# Patient Record
Sex: Female | Born: 1945 | Race: White | Hispanic: No | Smoking: Never smoker
Health system: Southern US, Community
[De-identification: ages and names within clinical notes are randomized; demographics above are authoritative.]

## PROBLEM LIST (undated history)

## (undated) DIAGNOSIS — H269 Unspecified cataract: Secondary | ICD-10-CM

## (undated) DIAGNOSIS — S9030XA Contusion of unspecified foot, initial encounter: Secondary | ICD-10-CM

## (undated) DIAGNOSIS — Z96652 Presence of left artificial knee joint: Secondary | ICD-10-CM

## (undated) DIAGNOSIS — Z9889 Other specified postprocedural states: Secondary | ICD-10-CM

## (undated) DIAGNOSIS — M1990 Unspecified osteoarthritis, unspecified site: Secondary | ICD-10-CM

## (undated) DIAGNOSIS — E559 Vitamin D deficiency, unspecified: Secondary | ICD-10-CM

## (undated) DIAGNOSIS — E663 Overweight: Secondary | ICD-10-CM

## (undated) DIAGNOSIS — R030 Elevated blood-pressure reading, without diagnosis of hypertension: Principal | ICD-10-CM

## (undated) HISTORY — DX: Contusion of unspecified foot, initial encounter: S90.30XA

## (undated) HISTORY — PX: BREAST BIOPSY: SHX20

## (undated) HISTORY — DX: Overweight: E66.3

## (undated) HISTORY — DX: Presence of left artificial knee joint: Z96.652

## (undated) HISTORY — DX: Other specified postprocedural states: Z98.890

## (undated) HISTORY — PX: REPLACEMENT TOTAL KNEE: SUR1224

## (undated) HISTORY — DX: Vitamin D deficiency, unspecified: E55.9

## (undated) HISTORY — DX: Unspecified osteoarthritis, unspecified site: M19.90

## (undated) HISTORY — PX: REDUCTION MAMMAPLASTY: SUR839

## (undated) HISTORY — DX: Unspecified cataract: H26.9

## (undated) HISTORY — DX: Elevated blood-pressure reading, without diagnosis of hypertension: R03.0

## (undated) HISTORY — PX: COLONOSCOPY: SHX174

## (undated) HISTORY — PX: BREAST SURGERY: SHX581

---

## 2011-10-15 DIAGNOSIS — H35379 Puckering of macula, unspecified eye: Secondary | ICD-10-CM | POA: Diagnosis not present

## 2011-10-15 DIAGNOSIS — H2510 Age-related nuclear cataract, unspecified eye: Secondary | ICD-10-CM | POA: Diagnosis not present

## 2012-01-04 DIAGNOSIS — J329 Chronic sinusitis, unspecified: Secondary | ICD-10-CM | POA: Diagnosis not present

## 2012-01-04 DIAGNOSIS — J029 Acute pharyngitis, unspecified: Secondary | ICD-10-CM | POA: Diagnosis not present

## 2012-01-05 DIAGNOSIS — J029 Acute pharyngitis, unspecified: Secondary | ICD-10-CM | POA: Diagnosis not present

## 2012-01-17 DIAGNOSIS — L570 Actinic keratosis: Secondary | ICD-10-CM | POA: Diagnosis not present

## 2012-01-17 DIAGNOSIS — L820 Inflamed seborrheic keratosis: Secondary | ICD-10-CM | POA: Diagnosis not present

## 2012-01-17 DIAGNOSIS — B079 Viral wart, unspecified: Secondary | ICD-10-CM | POA: Diagnosis not present

## 2012-01-17 DIAGNOSIS — D485 Neoplasm of uncertain behavior of skin: Secondary | ICD-10-CM | POA: Diagnosis not present

## 2012-03-19 DIAGNOSIS — M899 Disorder of bone, unspecified: Secondary | ICD-10-CM | POA: Diagnosis not present

## 2012-03-19 DIAGNOSIS — Z79899 Other long term (current) drug therapy: Secondary | ICD-10-CM | POA: Diagnosis not present

## 2012-03-19 DIAGNOSIS — M949 Disorder of cartilage, unspecified: Secondary | ICD-10-CM | POA: Diagnosis not present

## 2012-03-19 DIAGNOSIS — Z0000 Encounter for general adult medical examination without abnormal findings: Secondary | ICD-10-CM | POA: Diagnosis not present

## 2012-04-14 DIAGNOSIS — Z1211 Encounter for screening for malignant neoplasm of colon: Secondary | ICD-10-CM | POA: Diagnosis not present

## 2012-04-28 DIAGNOSIS — Z1231 Encounter for screening mammogram for malignant neoplasm of breast: Secondary | ICD-10-CM | POA: Diagnosis not present

## 2012-07-31 DIAGNOSIS — Z23 Encounter for immunization: Secondary | ICD-10-CM | POA: Diagnosis not present

## 2012-10-13 DIAGNOSIS — H2510 Age-related nuclear cataract, unspecified eye: Secondary | ICD-10-CM | POA: Diagnosis not present

## 2012-10-13 DIAGNOSIS — H353190 Nonexudative age-related macular degeneration, unspecified eye, stage unspecified: Secondary | ICD-10-CM | POA: Diagnosis not present

## 2012-10-13 DIAGNOSIS — H35379 Puckering of macula, unspecified eye: Secondary | ICD-10-CM | POA: Diagnosis not present

## 2012-10-13 DIAGNOSIS — H26019 Infantile and juvenile cortical, lamellar, or zonular cataract, unspecified eye: Secondary | ICD-10-CM | POA: Diagnosis not present

## 2012-11-04 DIAGNOSIS — M79609 Pain in unspecified limb: Secondary | ICD-10-CM | POA: Diagnosis not present

## 2012-12-24 ENCOUNTER — Ambulatory Visit
Admission: RE | Admit: 2012-12-24 | Discharge: 2012-12-24 | Disposition: A | Discharge status: Home / Self Care | Payer: Self-pay | Source: Ambulatory Visit | Attending: Family Medicine | Admitting: Family Medicine

## 2012-12-24 ENCOUNTER — Other Ambulatory Visit: Payer: Self-pay | Admitting: Family Medicine

## 2012-12-24 DIAGNOSIS — R52 Pain, unspecified: Secondary | ICD-10-CM

## 2012-12-24 DIAGNOSIS — M79609 Pain in unspecified limb: Secondary | ICD-10-CM | POA: Diagnosis not present

## 2012-12-24 DIAGNOSIS — M25519 Pain in unspecified shoulder: Secondary | ICD-10-CM | POA: Diagnosis not present

## 2012-12-24 DIAGNOSIS — Z23 Encounter for immunization: Secondary | ICD-10-CM | POA: Diagnosis not present

## 2012-12-24 DIAGNOSIS — Z1212 Encounter for screening for malignant neoplasm of rectum: Secondary | ICD-10-CM | POA: Diagnosis not present

## 2012-12-24 DIAGNOSIS — IMO0001 Reserved for inherently not codable concepts without codable children: Secondary | ICD-10-CM | POA: Diagnosis not present

## 2013-02-27 DIAGNOSIS — M25519 Pain in unspecified shoulder: Secondary | ICD-10-CM | POA: Diagnosis not present

## 2013-03-11 DIAGNOSIS — M25519 Pain in unspecified shoulder: Secondary | ICD-10-CM | POA: Diagnosis not present

## 2013-03-13 DIAGNOSIS — M7500 Adhesive capsulitis of unspecified shoulder: Secondary | ICD-10-CM | POA: Diagnosis not present

## 2013-03-13 DIAGNOSIS — M25519 Pain in unspecified shoulder: Secondary | ICD-10-CM | POA: Diagnosis not present

## 2013-05-04 DIAGNOSIS — M25579 Pain in unspecified ankle and joints of unspecified foot: Secondary | ICD-10-CM | POA: Diagnosis not present

## 2013-05-04 DIAGNOSIS — M79609 Pain in unspecified limb: Secondary | ICD-10-CM | POA: Diagnosis not present

## 2013-06-23 DIAGNOSIS — M25579 Pain in unspecified ankle and joints of unspecified foot: Secondary | ICD-10-CM | POA: Diagnosis not present

## 2013-06-23 DIAGNOSIS — M19079 Primary osteoarthritis, unspecified ankle and foot: Secondary | ICD-10-CM | POA: Diagnosis not present

## 2013-07-07 DIAGNOSIS — M25579 Pain in unspecified ankle and joints of unspecified foot: Secondary | ICD-10-CM | POA: Diagnosis not present

## 2013-07-14 DIAGNOSIS — M25579 Pain in unspecified ankle and joints of unspecified foot: Secondary | ICD-10-CM | POA: Diagnosis not present

## 2013-07-14 DIAGNOSIS — M19079 Primary osteoarthritis, unspecified ankle and foot: Secondary | ICD-10-CM | POA: Diagnosis not present

## 2013-07-28 DIAGNOSIS — Z23 Encounter for immunization: Secondary | ICD-10-CM | POA: Diagnosis not present

## 2013-09-06 IMAGING — CR DG HUMERUS 2V *R*
2 series · 2 of 2 positions shown · non-contrast
Comparison: None.

CLINICAL DATA: Over exertion with pain.  Duration of symptoms 5
weeks.

RIGHT HUMERUS - 2+ VIEW

[w humerus ap right *]
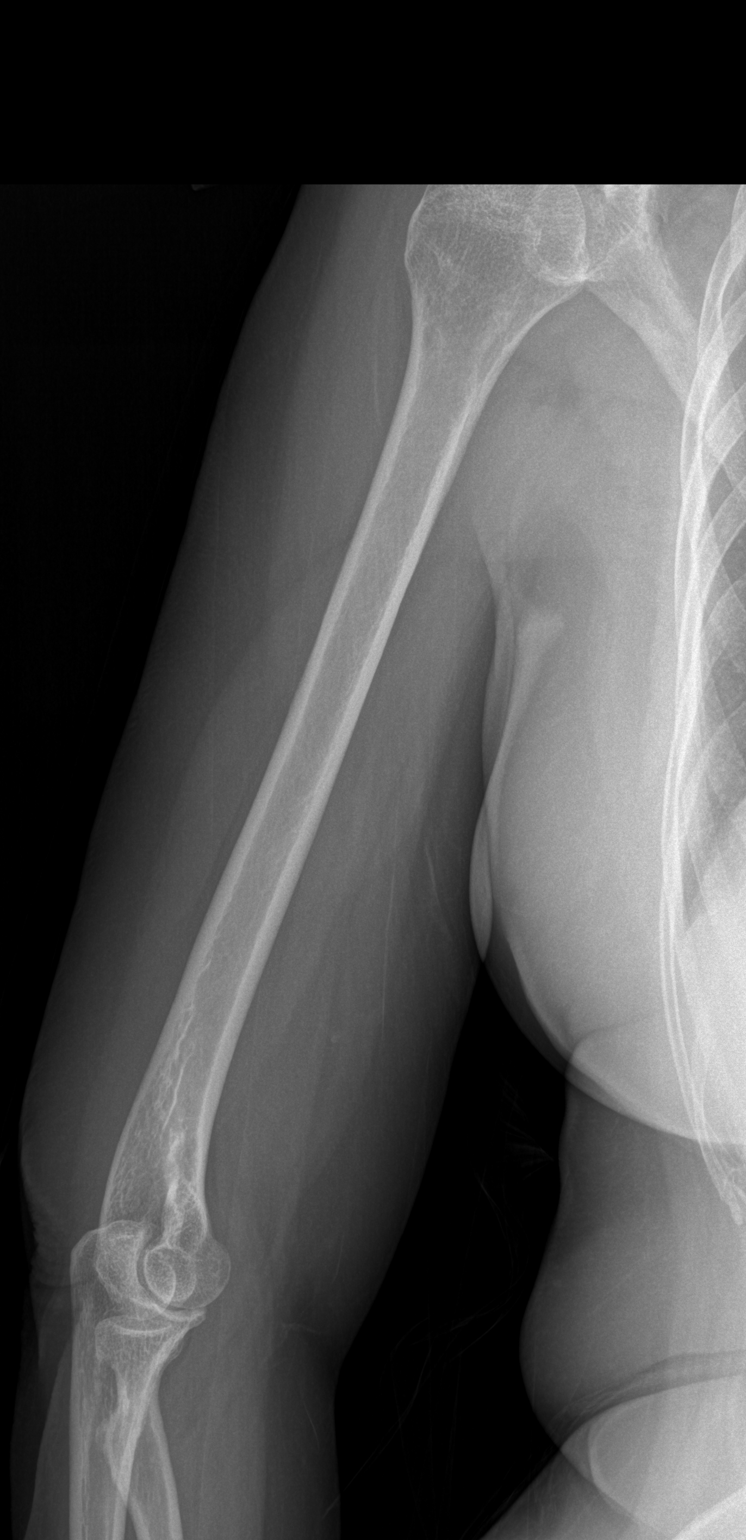

[w humerus lat right *]
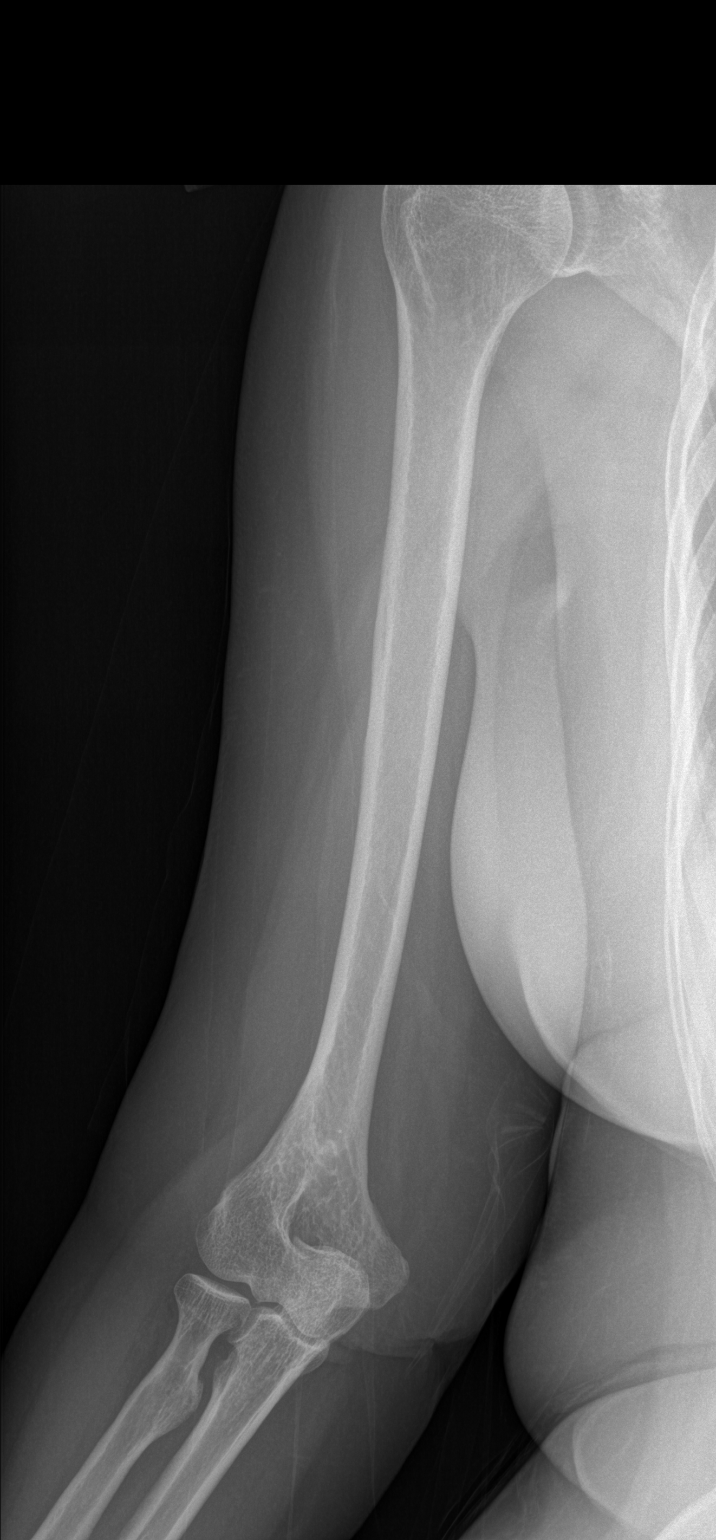

[2 of 2 positions shown; findings below may reference images not displayed]

FINDINGS: No evidence of fracture or other focal lesion.  Soft
tissues appear unremarkable.
IMPRESSION: Negative radiographs

## 2013-12-02 DIAGNOSIS — L7211 Pilar cyst: Secondary | ICD-10-CM | POA: Diagnosis not present

## 2013-12-02 DIAGNOSIS — D485 Neoplasm of uncertain behavior of skin: Secondary | ICD-10-CM | POA: Diagnosis not present

## 2013-12-02 DIAGNOSIS — L821 Other seborrheic keratosis: Secondary | ICD-10-CM | POA: Diagnosis not present

## 2013-12-04 ENCOUNTER — Other Ambulatory Visit: Payer: Self-pay

## 2013-12-04 DIAGNOSIS — Z9889 Other specified postprocedural states: Secondary | ICD-10-CM

## 2013-12-04 DIAGNOSIS — Z1231 Encounter for screening mammogram for malignant neoplasm of breast: Secondary | ICD-10-CM

## 2013-12-11 ENCOUNTER — Ambulatory Visit: Payer: Medicare Other

## 2014-01-28 DIAGNOSIS — L570 Actinic keratosis: Secondary | ICD-10-CM | POA: Diagnosis not present

## 2014-01-28 DIAGNOSIS — L819 Disorder of pigmentation, unspecified: Secondary | ICD-10-CM | POA: Diagnosis not present

## 2014-01-28 DIAGNOSIS — L821 Other seborrheic keratosis: Secondary | ICD-10-CM | POA: Diagnosis not present

## 2014-01-28 DIAGNOSIS — Z85828 Personal history of other malignant neoplasm of skin: Secondary | ICD-10-CM | POA: Diagnosis not present

## 2014-01-28 DIAGNOSIS — Z411 Encounter for cosmetic surgery: Secondary | ICD-10-CM | POA: Diagnosis not present

## 2014-01-28 DIAGNOSIS — D239 Other benign neoplasm of skin, unspecified: Secondary | ICD-10-CM | POA: Diagnosis not present

## 2014-03-17 ENCOUNTER — Ambulatory Visit
Admission: RE | Admit: 2014-03-17 | Discharge: 2014-03-17 | Disposition: A | Discharge status: Home / Self Care | Payer: Medicare Other | Source: Ambulatory Visit

## 2014-03-17 DIAGNOSIS — Z1231 Encounter for screening mammogram for malignant neoplasm of breast: Secondary | ICD-10-CM | POA: Diagnosis not present

## 2014-03-17 DIAGNOSIS — Z9889 Other specified postprocedural states: Secondary | ICD-10-CM

## 2014-07-27 DIAGNOSIS — Z23 Encounter for immunization: Secondary | ICD-10-CM | POA: Diagnosis not present

## 2014-11-28 IMAGING — MG MM SCREENING BREAST TOMO BILATERAL
8 of 12 series · 8 of 28 positions shown · non-contrast
Comparison: Previous exam(s).

CLINICAL DATA: Screening.

EXAM:
DIGITAL SCREENING BILATERAL MAMMOGRAM WITH 3D TOMO WITH CAD

[L MLO]
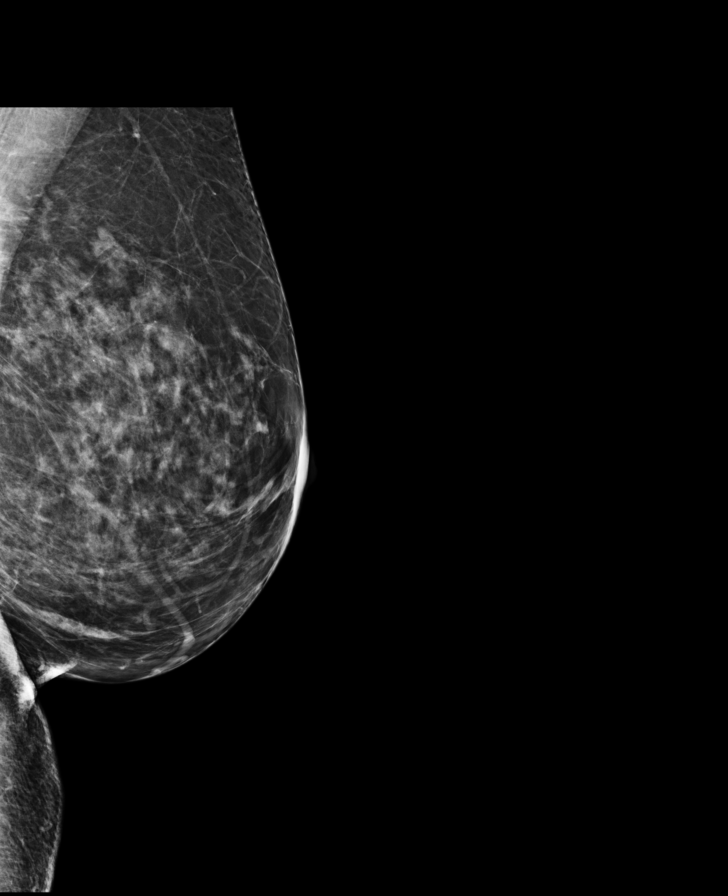

[R CC]
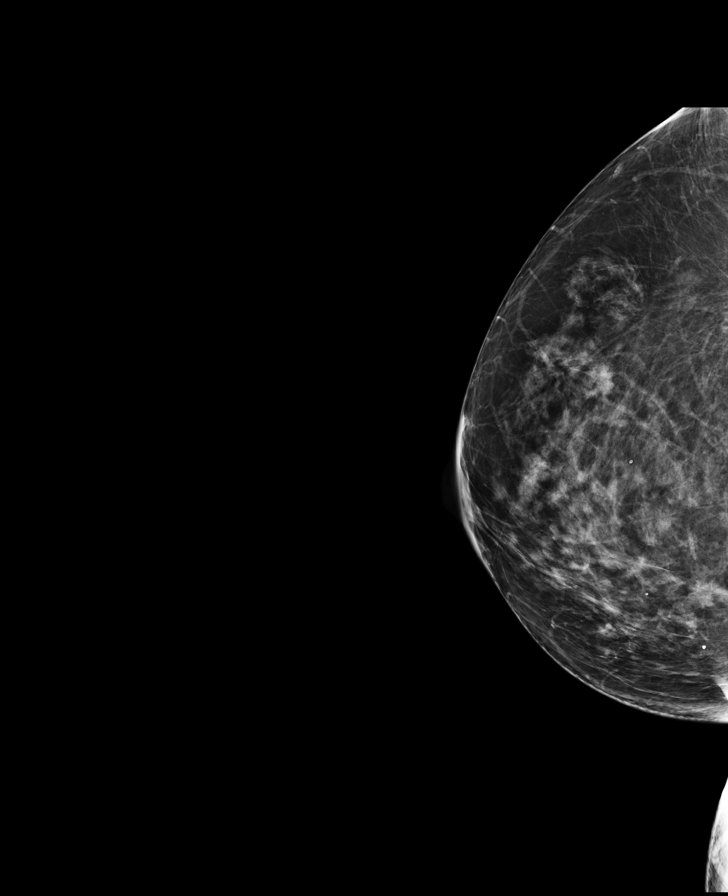

[R CC synth-2D]
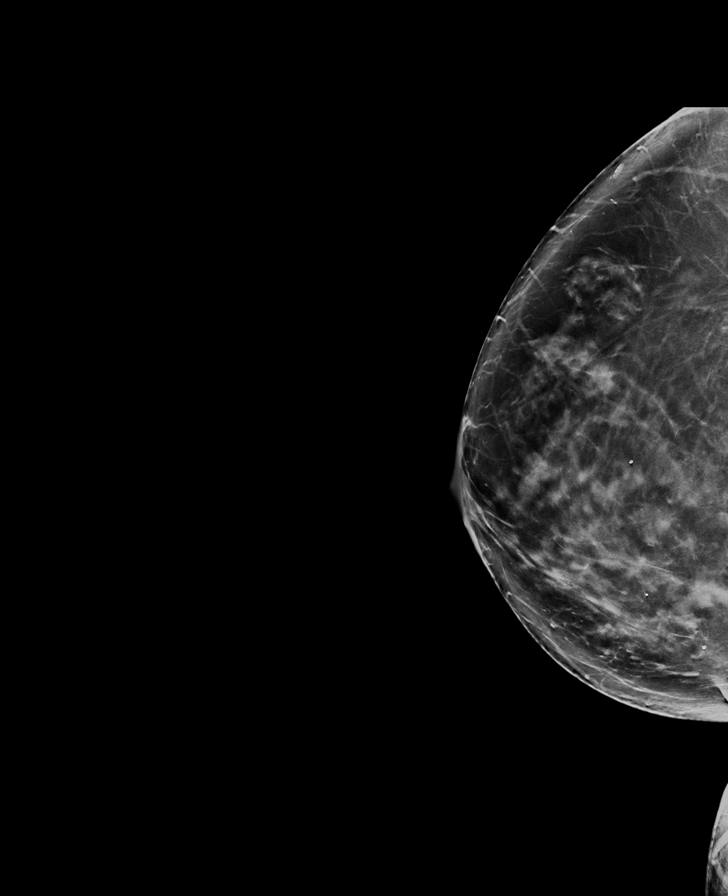

[R MLO]
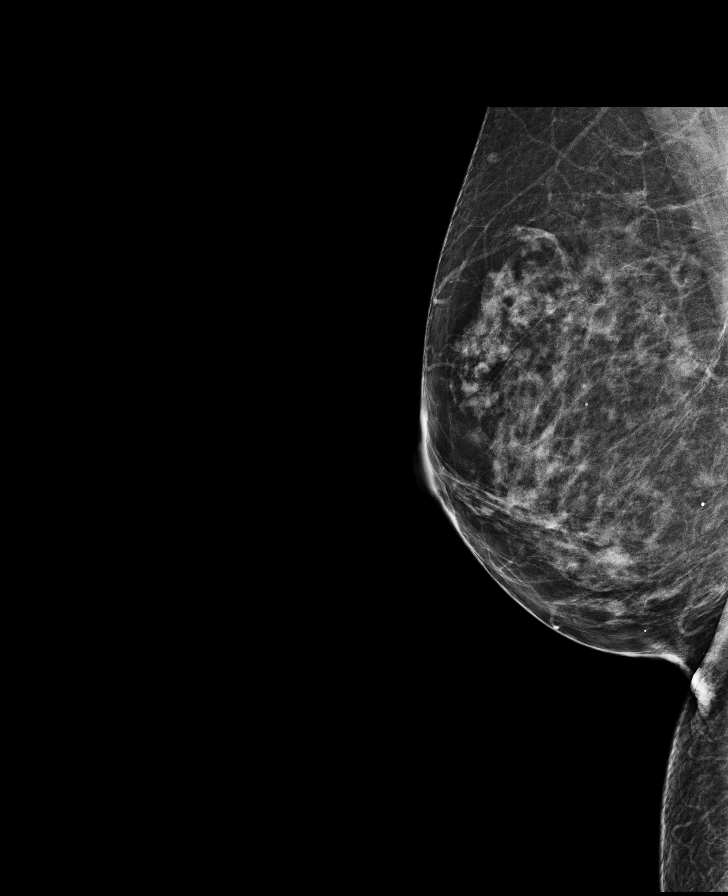

[R MLO synth-2D]
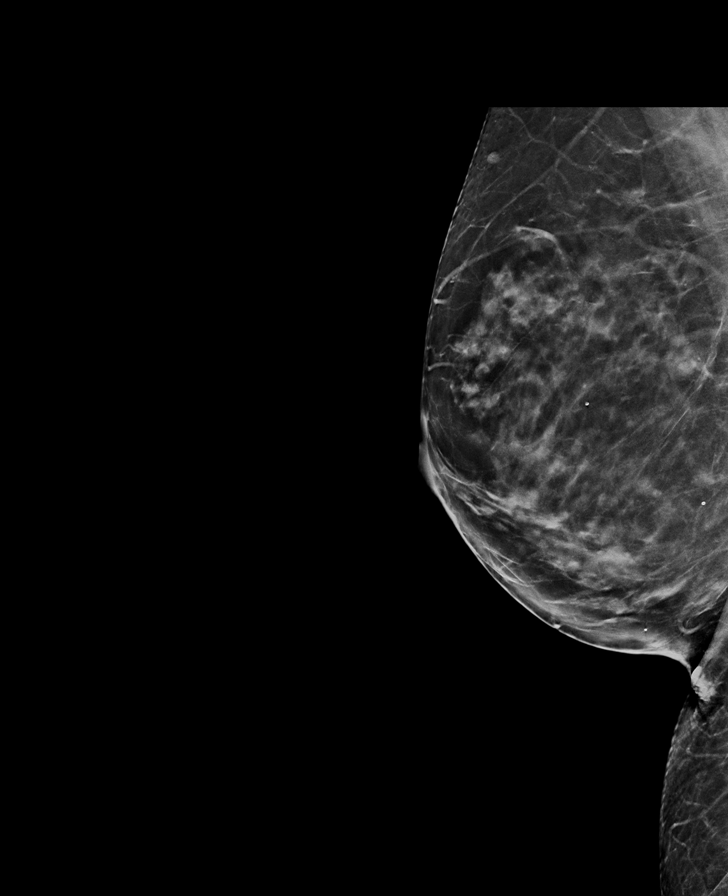

[L MLO synth-2D]
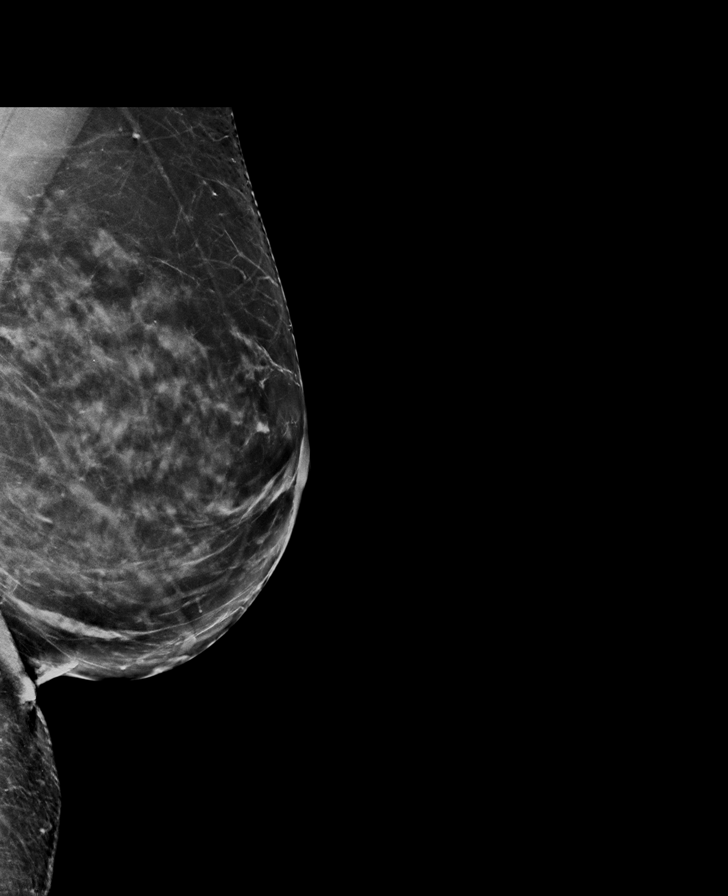

[L CC synth-2D]
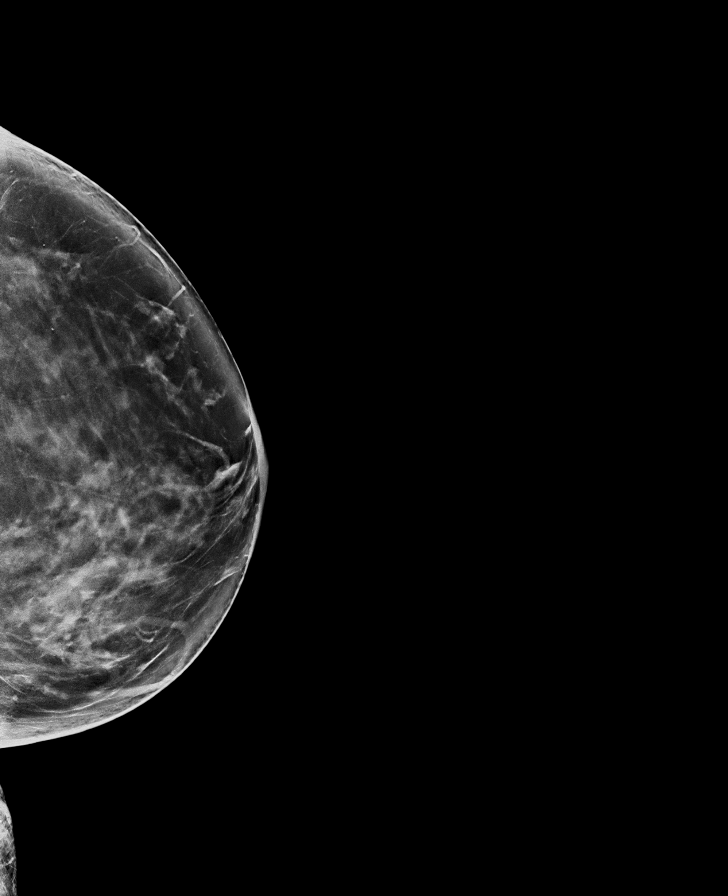

[L CC]
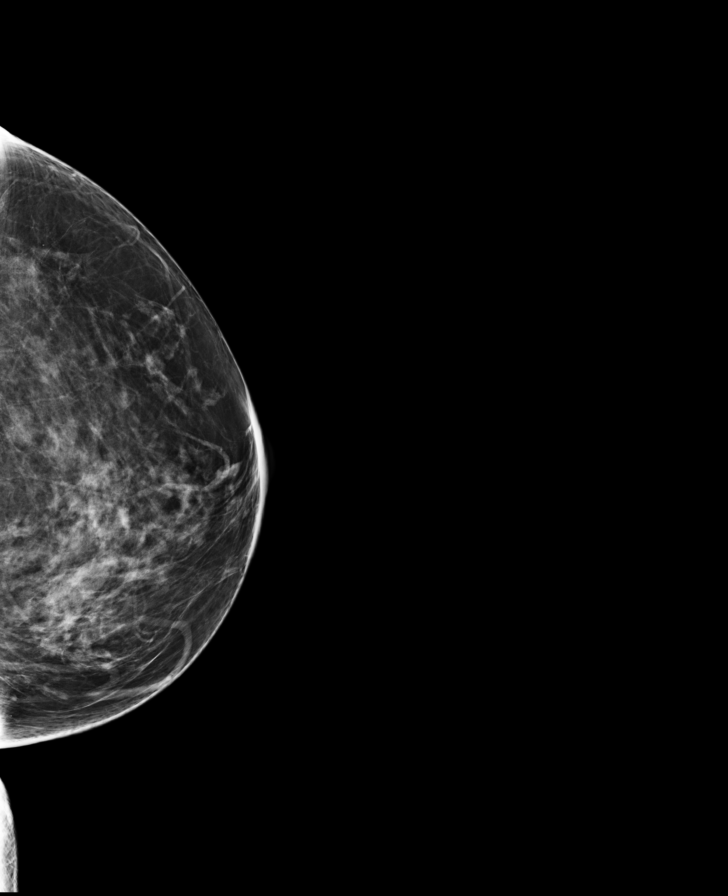

[8 of 28 positions shown; findings below may reference images not displayed]

ACR Breast Density Category c: The breast tissue is heterogeneously
dense, which may obscure small masses.
FINDINGS: There are no findings suspicious for malignancy. Images were
processed with CAD.
IMPRESSION: No mammographic evidence of malignancy. A result letter of this
screening mammogram will be mailed directly to the patient.

RECOMMENDATION:
Screening mammogram in one year. (Code:[id number])

BI-RADS CATEGORY  1: Negative.

## 2014-12-23 DIAGNOSIS — L570 Actinic keratosis: Secondary | ICD-10-CM | POA: Diagnosis not present

## 2015-03-02 DIAGNOSIS — L814 Other melanin hyperpigmentation: Secondary | ICD-10-CM | POA: Diagnosis not present

## 2015-03-02 DIAGNOSIS — L821 Other seborrheic keratosis: Secondary | ICD-10-CM | POA: Diagnosis not present

## 2015-03-02 DIAGNOSIS — Z85828 Personal history of other malignant neoplasm of skin: Secondary | ICD-10-CM | POA: Diagnosis not present

## 2015-03-02 DIAGNOSIS — L723 Sebaceous cyst: Secondary | ICD-10-CM | POA: Diagnosis not present

## 2015-03-02 DIAGNOSIS — D2272 Melanocytic nevi of left lower limb, including hip: Secondary | ICD-10-CM | POA: Diagnosis not present

## 2015-07-11 ENCOUNTER — Other Ambulatory Visit: Payer: Self-pay

## 2015-07-11 DIAGNOSIS — Z1231 Encounter for screening mammogram for malignant neoplasm of breast: Secondary | ICD-10-CM

## 2015-07-14 DIAGNOSIS — N390 Urinary tract infection, site not specified: Secondary | ICD-10-CM | POA: Diagnosis not present

## 2015-07-14 DIAGNOSIS — R300 Dysuria: Secondary | ICD-10-CM | POA: Diagnosis not present

## 2015-07-14 DIAGNOSIS — R350 Frequency of micturition: Secondary | ICD-10-CM | POA: Diagnosis not present

## 2015-08-01 DIAGNOSIS — H43813 Vitreous degeneration, bilateral: Secondary | ICD-10-CM | POA: Diagnosis not present

## 2015-08-01 DIAGNOSIS — H35373 Puckering of macula, bilateral: Secondary | ICD-10-CM | POA: Diagnosis not present

## 2015-08-01 DIAGNOSIS — H2513 Age-related nuclear cataract, bilateral: Secondary | ICD-10-CM | POA: Diagnosis not present

## 2015-08-02 DIAGNOSIS — N390 Urinary tract infection, site not specified: Secondary | ICD-10-CM | POA: Diagnosis not present

## 2015-08-16 ENCOUNTER — Ambulatory Visit
Admission: RE | Admit: 2015-08-16 | Discharge: 2015-08-16 | Disposition: A | Discharge status: Home / Self Care | Payer: Medicare Other | Source: Ambulatory Visit

## 2015-08-16 DIAGNOSIS — Z1231 Encounter for screening mammogram for malignant neoplasm of breast: Secondary | ICD-10-CM | POA: Diagnosis not present

## 2016-03-07 DIAGNOSIS — L821 Other seborrheic keratosis: Secondary | ICD-10-CM | POA: Diagnosis not present

## 2016-03-07 DIAGNOSIS — D3610 Benign neoplasm of peripheral nerves and autonomic nervous system, unspecified: Secondary | ICD-10-CM | POA: Diagnosis not present

## 2016-03-07 DIAGNOSIS — L723 Sebaceous cyst: Secondary | ICD-10-CM | POA: Diagnosis not present

## 2016-03-07 DIAGNOSIS — D2272 Melanocytic nevi of left lower limb, including hip: Secondary | ICD-10-CM | POA: Diagnosis not present

## 2016-03-07 DIAGNOSIS — L814 Other melanin hyperpigmentation: Secondary | ICD-10-CM | POA: Diagnosis not present

## 2016-03-07 DIAGNOSIS — Z85828 Personal history of other malignant neoplasm of skin: Secondary | ICD-10-CM | POA: Diagnosis not present

## 2016-04-05 ENCOUNTER — Emergency Department (HOSPITAL_COMMUNITY)
Admission: EM | Admit: 2016-04-05 | Discharge: 2016-04-05 | Disposition: A | Discharge status: Home / Self Care | Payer: Medicare Other | Attending: Emergency Medicine | Admitting: Emergency Medicine

## 2016-04-05 ENCOUNTER — Emergency Department (HOSPITAL_COMMUNITY): Payer: Medicare Other

## 2016-04-05 ENCOUNTER — Encounter (HOSPITAL_COMMUNITY): Payer: Self-pay | Admitting: *Deleted

## 2016-04-05 DIAGNOSIS — Y999 Unspecified external cause status: Secondary | ICD-10-CM | POA: Diagnosis not present

## 2016-04-05 DIAGNOSIS — Z7982 Long term (current) use of aspirin: Secondary | ICD-10-CM | POA: Diagnosis not present

## 2016-04-05 DIAGNOSIS — S022XXA Fracture of nasal bones, initial encounter for closed fracture: Secondary | ICD-10-CM | POA: Diagnosis not present

## 2016-04-05 DIAGNOSIS — Z79899 Other long term (current) drug therapy: Secondary | ICD-10-CM | POA: Diagnosis not present

## 2016-04-05 DIAGNOSIS — Y929 Unspecified place or not applicable: Secondary | ICD-10-CM | POA: Insufficient documentation

## 2016-04-05 DIAGNOSIS — W0110XA Fall on same level from slipping, tripping and stumbling with subsequent striking against unspecified object, initial encounter: Secondary | ICD-10-CM | POA: Insufficient documentation

## 2016-04-05 DIAGNOSIS — W19XXXA Unspecified fall, initial encounter: Secondary | ICD-10-CM | POA: Diagnosis not present

## 2016-04-05 DIAGNOSIS — Y9389 Activity, other specified: Secondary | ICD-10-CM | POA: Diagnosis not present

## 2016-04-05 DIAGNOSIS — S0993XA Unspecified injury of face, initial encounter: Secondary | ICD-10-CM | POA: Diagnosis not present

## 2016-04-05 DIAGNOSIS — S0083XA Contusion of other part of head, initial encounter: Secondary | ICD-10-CM | POA: Diagnosis not present

## 2016-04-05 MED ORDER — IBUPROFEN 200 MG PO TABS
400.0000 mg | ORAL_TABLET | Freq: Once | ORAL | Status: AC
  Administered 2016-04-05: 400 mg via ORAL
  Filled 2016-04-05: qty 2

## 2016-04-05 MED ORDER — IBUPROFEN 400 MG PO TABS: 400.0000 mg | ORAL_TABLET | Freq: Four times a day (QID) | ORAL | Status: DC | PRN

## 2016-04-05 NOTE — ED Provider Notes (Signed)
CSN: [id number]     Arrival date & time 04/05/16  2020 History   First MD Initiated Contact with Patient 04/05/16 2039     Chief Complaint  Patient presents with  . Facial Injury   HPI Patient presents to the emergency room for evaluation of a facial injury. Patient was carrying items into her house today when she tripped and fell and landed on her face. She struck her nose and since then has developed bruising and swelling around her nose and her eyes. Patient denies any loss of consciousness. No visual changes. No headache. No neck pain. She Went to a walk-in clinic this evening who felt she needed to have a CT scan of her face a couple hours of the bruising that they noted on exam. History reviewed. No pertinent past medical history. History reviewed. No pertinent past surgical history. No family history on file. Social History  Substance Use Topics  . Smoking status: Never Smoker   . Smokeless tobacco: None  . Alcohol Use: No   OB History    No data available     Review of Systems  All other systems reviewed and are negative.     Allergies  Sulfa antibiotics  Home Medications   Prior to Admission medications   Medication Sig Start Date End Date Taking? Authorizing Provider  aspirin EC 81 MG tablet Take 81 mg by mouth daily.   Yes Historical Provider, MD  BIOTIN PO Take 1 tablet by mouth daily.   Yes Historical Provider, MD  calcium carbonate (OS-CAL - DOSED IN MG OF ELEMENTAL CALCIUM) 1250 (500 Ca) MG tablet Take 1 tablet by mouth daily with breakfast.   Yes Historical Provider, MD   BP 156/74 mmHg  Pulse 67  Resp 15  Ht 5\' 3"  (1.6 m)  Wt 68.04 kg  BMI 26.58 kg/m2  SpO2 97% Physical Exam  Constitutional: She appears well-developed and well-nourished. No distress.  HENT:  Head: Normocephalic.  Right Ear: External ear normal.  Left Ear: External ear normal.  Nose: Sinus tenderness present. No nose lacerations, nasal deformity, septal deviation or nasal septal  hematoma.  Peri nasal ecchymoses, EOMI, no inferior orbital ttp  Eyes: Conjunctivae are normal. Right eye exhibits no discharge. Left eye exhibits no discharge. No scleral icterus.  Neck: Neck supple. No tracheal deviation present.  Cardiovascular: Normal rate.   Pulmonary/Chest: Effort normal. No stridor. No respiratory distress.  Musculoskeletal: She exhibits no edema.  Neurological: She is alert. Cranial nerve deficit: no gross deficits.  Skin: Skin is warm and dry. No rash noted.  Psychiatric: She has a normal mood and affect.  Nursing note and vitals reviewed.   ED Course  Procedures (including critical care time) Labs Review Labs Reviewed - No data to display  Imaging Review Ct Maxillofacial Wo Cm  04/05/2016  CLINICAL DATA:  Facial injury from fall.  Initial encounter. EXAM: CT MAXILLOFACIAL WITHOUT CONTRAST TECHNIQUE: Multidetector CT imaging of the maxillofacial structures was performed. Multiplanar CT image reconstructions were also generated. A small metallic BB was placed on the right temple in order to reliably differentiate right from left. COMPARISON:  None. FINDINGS: Nondisplaced nasal arch fracture most convincing on sagittal reformats. Minimal mucosal gas on the left deep to the nasal arch. There is an overlying contusion with soft tissue emphysema tracking towards the right medial canthus. No opaque foreign body. Left maxillary fluid level is small but likely low-density. This could be related to epistaxis or inflammation as there is no associated  fracture. Nasal septum is deviated to the right without fracture lucency. Located and intact mandible. Incidental left TMJ osteoarthritis. No evidence of globe injury or postseptal hematoma. IMPRESSION: 1. Nondisplaced nasal arch fracture with overlying laceration and contusion. 2. Left maxillary sinus fluid level without associated fracture. Electronically Signed   By: Monte Fantasia M.D.   On: 04/05/2016 22:36   I have personally  reviewed and evaluated these images and lab results as part of my medical decision-making.    MDM   Final diagnoses:  Nasal fracture, closed, initial encounter    Nasal fx on CT.  Treat with ice and nsaids.  CT scan mentions a laceration but patient does not have a laceration on physical exam.    Dorie Rank, MD 04/05/16 2253

## 2016-04-05 NOTE — ED Notes (Signed)
Pt sent from [facility]; pt was carrying stuff into her house when she tripped and states that she landed on her face; pt with bruising and swelling to her nose and around her rt eye; pt states that she is not having visual changes; pt states that she cannot get air in through her nose and is having to breathe to her mouth; pt states that she takes 81mg  ASA daily; pt denies LOC

## 2016-04-05 NOTE — Discharge Instructions (Signed)
Nasal Fracture A nasal fracture is a break or crack in the bones or cartilage of the nose. Minor breaks do not require treatment. These breaks usually heal on their own after about one month. Serious breaks may require surgery. CAUSES This injury is usually caused by a blunt injury to the nose. This type of injury often occurs from:  Contact sports.  Car accidents.  Falls.  Getting punched. SYMPTOMS Symptoms of this injury include:  Pain.  Swelling of the nose.  Bleeding from the nose.  Bruising around the nose or eyes. This may include having black eyes.  Crooked appearance of the nose. DIAGNOSIS This injury may be diagnosed with a physical exam. The health care provider will gently feel the nose for signs of broken bones. He or she will look inside the nostrils to make sure that there is not a blood-filled swelling on the dividing wall between the nostrils (septal hematoma). X-rays of the nose may not show a nasal fracture even when one is present. In some cases, X-rays or a CT scan may be done 1-5 days after the injury. Sometimes, the health care provider will want to wait until the swelling has gone down. TREATMENT Often, minor fractures that have caused no deformity do not require treatment. More serious fractures in which bones have moved out of position may require surgery, which will take place after the swelling is gone. Surgery will stabilize and align the fracture. In some cases, a health care provider may be able to reposition the bones without surgery. This may be done in the health care provider's office after medicine is given to numb the area (local anesthetic). HOME CARE INSTRUCTIONS  If directed, apply ice to the injured area:  Put ice in a plastic bag.  Place a towel between your skin and the bag.  Leave the ice on for 20 minutes, 2-3 times per day.  Take over-the-counter and prescription medicines only as told by your health care provider.  If your nose  starts to bleed, sit in an upright position while you squeeze the soft parts of your nose against the dividing wall between your nostrils (septum) for 10 minutes.  Try to avoid blowing your nose.  Return to your normal activities as told by your health care provider. Ask your health care provider what activities are safe for you.  Avoid contact sports for 3-4 weeks or as told by your health care provider.  Keep all follow-up visits as told by your health care provider. This is important. SEEK MEDICAL CARE IF:  Your pain increases or becomes severe.  You continue to have nosebleeds.  The shape of your nose does not return to normal within 5 days.  You have pus draining out of your nose. SEEK IMMEDIATE MEDICAL CARE IF:  You have bleeding from your nose that does not stop after you pinch your nostrils closed for 20 minutes and keep ice on your nose.  You have clear fluid draining out of your nose.  You notice a grape-like swelling on the septum. This swelling is a collection of blood (hematoma) that must be drained to help prevent infection.  You have difficulty moving your eyes.  You have repeated vomiting.   This information is not intended to replace advice given to you by your health care provider. Make sure you discuss any questions you have with your health care provider.   Document Released: 08/31/2000 Document Revised: 05/25/2015 Document Reviewed: 10/11/2014 Elsevier Interactive Patient Education 2016 [employer]  

## 2016-04-09 ENCOUNTER — Ambulatory Visit (INDEPENDENT_AMBULATORY_CARE_PROVIDER_SITE_OTHER): Payer: Medicare Other | Admitting: Family Medicine

## 2016-04-09 ENCOUNTER — Encounter: Payer: Self-pay | Admitting: Family Medicine

## 2016-04-09 VITALS — BP 163/82 | HR 40 | Ht 62.25 in | Wt 148.9 lb

## 2016-04-09 DIAGNOSIS — E559 Vitamin D deficiency, unspecified: Secondary | ICD-10-CM

## 2016-04-09 DIAGNOSIS — Z9013 Acquired absence of bilateral breasts and nipples: Secondary | ICD-10-CM | POA: Diagnosis not present

## 2016-04-09 DIAGNOSIS — E663 Overweight: Secondary | ICD-10-CM | POA: Diagnosis not present

## 2016-04-09 DIAGNOSIS — S9030XA Contusion of unspecified foot, initial encounter: Secondary | ICD-10-CM | POA: Insufficient documentation

## 2016-04-09 DIAGNOSIS — R030 Elevated blood-pressure reading, without diagnosis of hypertension: Secondary | ICD-10-CM

## 2016-04-09 DIAGNOSIS — Z96652 Presence of left artificial knee joint: Secondary | ICD-10-CM | POA: Insufficient documentation

## 2016-04-09 DIAGNOSIS — Z9889 Other specified postprocedural states: Secondary | ICD-10-CM | POA: Insufficient documentation

## 2016-04-09 HISTORY — DX: Overweight: E66.3

## 2016-04-09 HISTORY — DX: Other specified postprocedural states: Z98.890

## 2016-04-09 HISTORY — DX: Presence of left artificial knee joint: Z96.652

## 2016-04-09 HISTORY — DX: Elevated blood-pressure reading, without diagnosis of hypertension: R03.0

## 2016-04-09 HISTORY — DX: Contusion of unspecified foot, initial encounter: S90.30XA

## 2016-04-09 HISTORY — DX: Vitamin D deficiency, unspecified: E55.9

## 2016-04-09 NOTE — Patient Instructions (Signed)
    Continue to monitor your blood pressure at home. Write down the values and bring in next office visit.    Top Ten Foods for Health  1. Water Drink at least 8 to 12 cups of water daily. Consume half of your body weight in pounds, is the amount of water in ounces to drink daily.  Ie: a 200lb person = 100 oz water daily  2. Dark Green Vegetables Eat dark green vegetables at least three to four times a week. Good options include broccoli, peppers, brussel sprouts and leafy greens like kale and spinach.  3. Whole Grains Whole grains should be included in your diet at least two to three times daily. Look for whole wheat flour, rye, oatmeal, barley, amaranth, quinoa or a multigrain. A good source of fiber includes 3 to 4 grams of fiber per serving. A great source has 5 or more grams of fiber per serving.  4. Beans and Lentils Try to eat a bean-based meal at least once a week. Try to add legumes, including beans and lentils, to soups, stews, casseroles, salads and dips or eat them plain.  5. Fish Try to eat two to three serving of fish a week. A serving consists of 3 to 4 ounces of cooked fish. Good choices are salmon, trout, herring, bluefish, sardines and tuna.  6. Berries Include two to four servings of fruit in your diet each day. Try to eat berries such as raspberries, blueberries, blackberries and strawberries.  7. Winter Squash Eat butternut and acorn squash as well as other richly pigmented dark orange and green colored vegetables like sweet potato, cantaloupe and mango.  8. Soy 25 grams of soy protein a day is recommended as part of a low-fat diet to help lower cholesterol levels. Try tofu, soymilk, edamame soybeans, tempeh and texturized vegetable protein (TVP).  9. Flaxseed, Nuts and Seeds Add 1 to 2 tablespoons of ground flaxseed or other seeds to food each day or include a moderate amount of nuts - 1/4 cup - in your daily diet.  10. Organic Yogurt Men and women between 64  and 69 years of age need 1000 milligrams of calcium a day and 1200 milligrams if 50 or older. Eat calcium-rich foods such as nonfat or low-fat dairy products three to four times a day. Include organic choices.

## 2016-04-09 NOTE — Progress Notes (Signed)
New patient office visit  Impression and Recommendations:    1. Elevated blood pressure (not hypertension)   2. Overweight (BMI 25.0-29.9)   3. Contusion, foot, unspecified laterality, initial encounter   4. Status post left knee replacement- 2004   5. S/P bilateral breast reduction   6. Vitamin D insufficiency      She'll continue to monitor blood pressure at home. Told her to write it down and keep a log and bring in next office visit  Patient will call Dr. Scarlette Ar office, [facility] orthopedics about continued foot pain that she's having Still after injury 2 years ago when she contused her foot.  Dr. Delilah Shan did an MRI and x-rays, and she will follow-up with them for this.  If she needs a referral she will let us know.  Handouts on healthy eating provided; BMI norms discussed and counseling performed.  Patient's Medications  New Prescriptions   No medications on file  Previous Medications   ASPIRIN EC 81 MG TABLET    Take 81 mg by mouth daily.   BIOTIN PO    Take 1 tablet by mouth daily.   CALCIUM CARBONATE (OS-CAL - DOSED IN MG OF ELEMENTAL CALCIUM) 1250 (500 CA) MG TABLET    Take 1 tablet by mouth daily with breakfast.   IBUPROFEN (ADVIL,MOTRIN) 400 MG TABLET    Take 1 tablet (400 mg total) by mouth every 6 (six) hours as needed for mild pain or moderate pain.  Modified Medications   No medications on file  Discontinued Medications   No medications on file    Return in about 4 weeks (around 05/07/2016) for For wellness exam & health maintenance evaluation.  The patient was counseled, risk factors were discussed, anticipatory guidance given.  Gross side effects, risk and benefits, and alternatives of medications discussed with patient.  Patient is aware that all medications have potential side effects and we are unable to predict every side effect or drug-drug interaction that may occur.  Expresses verbal understanding and consents to current therapy plan and  treatment regimen.  Please see AVS handed out to patient at the end of our visit for further patient instructions/ counseling done pertaining to today's office visit.    Note: This document was prepared using Dragon voice recognition software and may include unintentional dictation errors.  ----------------------------------------------------------------------------------------------------------------------    Subjective:    Chief Complaint  Patient presents with  . Establish Care    New patient visit.  HPI: Joann Wall is a pleasant 70 y.o. female who presents to [place] [facility] today to review their medical history with me and establish care.   Had seen Dr. Arelia Sneddon 2 years ago for her foot and he sent patient to [place].   Prior she had a Dr in [place], was last PCP- over 4 yrs ago.   Husband passed 7 yrs ago- and patient moved back to [place].   No history of hypertension.   Checks her BP - most days of the week b/c husband had HTN and she has machine.  Runs 140-120/ 80-70. No chest pain, shortness of breath, headache, visual changes, peripheral edema   Contusion of R top of foot several yrs ago- 2+, saw Dr Delilah Shan [facility]- had MRI and xrays etc.  She was told to stay off it,  so it would heal and patient never did.    It hurts her most days and also swells by the  end of the day.   Has not been able to start a walking program because this.      Patient Active Problem List   Diagnosis Date Noted  . S/P bilateral breast reduction- 2000 04/09/2016  . Status post left knee replacement- 2004 04/09/2016  . Contusion, R foot- sees dr Delilah Shan- [facility] 04/09/2016  . Elevated blood pressure (not hypertension) 04/09/2016  . Overweight (BMI 25.0-29.9) 04/09/2016  . Vitamin D insufficiency 04/09/2016     Past Medical History:  Diagnosis Date  . Contusion, R foot- sees dr Delilah Shan- [facility] 04/09/2016  . Elevated blood pressure (not  hypertension) 04/09/2016  . Overweight (BMI 25.0-29.9) 04/09/2016  . S/P bilateral breast reduction- 2000 04/09/2016  . Status post left knee replacement- 2004 04/09/2016  . Vitamin D insufficiency 04/09/2016     Past Surgical History:  Procedure Laterality Date  . BREAST SURGERY Bilateral    reduction  . REPLACEMENT TOTAL KNEE Left      Family History  Problem Relation Age of Onset  . Stroke Mother   . COPD Father   . Obesity Sister   . Hypertension Sister   . Crohn's disease Brother   . Healthy Daughter   . Healthy Son   . Suicidality Brother      History  Drug Use No    History  Alcohol Use  . 8.4 oz/week  . 14 Glasses of wine per week    History  Smoking Status  . Never Smoker  Smokeless Tobacco  . Never Used    Patient's Medications  New Prescriptions   No medications on file  Previous Medications   ASPIRIN EC 81 MG TABLET    Take 81 mg by mouth daily.   BIOTIN PO    Take 1 tablet by mouth daily.   CALCIUM CARBONATE (OS-CAL - DOSED IN MG OF ELEMENTAL CALCIUM) 1250 (500 CA) MG TABLET    Take 1 tablet by mouth daily with breakfast.   IBUPROFEN (ADVIL,MOTRIN) 400 MG TABLET    Take 1 tablet (400 mg total) by mouth every 6 (six) hours as needed for mild pain or moderate pain.  Modified Medications   No medications on file  Discontinued Medications   No medications on file    Sulfa antibiotics  Review of Systems:   ( Completed via Adult Medical History Intake form today ) General:  Denies fever, chills, appetite changes, unexplained weight loss.  Optho/Auditory:   Denies visual changes, blurred vision/LOV, ringing in ears/ diff hearing Respiratory:   Denies SOB, DOE, cough, wheezing.  Cardiovascular:   Denies chest pain, palpitations, new onset peripheral edema  Gastrointestinal:   Denies nausea, vomiting, diarrhea.  Genitourinary:    Denies dysuria, increased frequency, flank pain.  Endocrine:     Denies hot or cold intolerance, polyuria,  polydipsia. Musculoskeletal:  Denies unexplained myalgias, joint swelling, arthralgias, gait problems.  Skin:  Denies rash, suspicious lesions or new/ changes in moles Neurological:    Denies dizziness, syncope, unexplained weakness, lightheadedness, numbness  Psychiatric/Behavioral:   Denies mood changes, suicidal or homicidal ideations, hallucinations    Objective:   Blood pressure (!) 173/79, pulse (!) 40, height 5' 2.25" (1.581 m), weight 148 lb 14.4 oz (67.5 kg). Body mass index is 27.02 kg/m.  Vitals:   04/09/16 1512 04/09/16 1542  BP: (!) 173/79 (!) 163/82  Pulse: (!) 40     General: Well Developed, well nourished, and in no acute distress.  Neuro: Alert and oriented x3, extra-ocular muscles  intact, sensation grossly intact.  HEENT: Normocephalic, atraumatic, pupils equal round reactive to light, neck supple, no gross masses, no carotid bruits, no JVD apprec Skin: no gross suspicious lesions or rashes  Cardiac: Regular rate and rhythm, no murmurs rubs or gallops.  Respiratory: Essentially clear to auscultation bilaterally. Not using accessory muscles, speaking in full sentences.  Abdominal: Soft, not grossly distended Musculoskeletal: Ambulates w/o diff, FROM * 4 ext.  Vasc: less 2 sec cap RF, warm and pink  Psych:  No HI/SI, judgement and insight good.

## 2016-05-14 ENCOUNTER — Encounter: Payer: Self-pay | Admitting: Family Medicine

## 2016-05-14 ENCOUNTER — Ambulatory Visit (INDEPENDENT_AMBULATORY_CARE_PROVIDER_SITE_OTHER): Payer: Medicare Other | Admitting: Family Medicine

## 2016-05-14 VITALS — BP 150/83 | HR 61 | Wt 151.1 lb

## 2016-05-14 DIAGNOSIS — E559 Vitamin D deficiency, unspecified: Secondary | ICD-10-CM

## 2016-05-14 DIAGNOSIS — E663 Overweight: Secondary | ICD-10-CM | POA: Diagnosis not present

## 2016-05-14 DIAGNOSIS — Z1211 Encounter for screening for malignant neoplasm of colon: Secondary | ICD-10-CM

## 2016-05-14 DIAGNOSIS — Z0000 Encounter for general adult medical examination without abnormal findings: Secondary | ICD-10-CM | POA: Diagnosis not present

## 2016-05-14 DIAGNOSIS — Z1212 Encounter for screening for malignant neoplasm of rectum: Secondary | ICD-10-CM

## 2016-05-14 DIAGNOSIS — R030 Elevated blood-pressure reading, without diagnosis of hypertension: Secondary | ICD-10-CM

## 2016-05-14 NOTE — Patient Instructions (Signed)
-  AHA guidelines for exercise of 150 minutes of moderate intensity aerobic activity per week discussed and encouraged.   Regular exercise will improve brain function and memory, as well as improve mood, boost immune system and help with weight management, among the other, more well-known effects of exercise such as decreasing risk for hypertension, diabetes, hyperlipidemia etc.  -  The AHA strongly endorses consumption of a diet that contains a variety of foods from all the food categories with an emphasis on fruits and vegetables; fat-free and low-fat dairy products; cereal and grain products; legumes and nuts; and fish, poultry, and or lean meats.   Excessive food intake, especially of foods high in saturated and trans fats, sugar, and salt, should be avoided    DASH Eating Plan DASH stands for "Dietary Approaches to Stop Hypertension." The DASH eating plan is a healthy eating plan that has been shown to reduce high blood pressure (hypertension). Additional health benefits may include reducing the risk of type 2 diabetes mellitus, heart disease, and stroke. The DASH eating plan may also help with weight loss. WHAT DO I NEED TO KNOW ABOUT THE DASH EATING PLAN? For the DASH eating plan, you will follow these general guidelines:  Choose foods with a percent daily value for sodium of less than 5% (as listed on the food label).  Use salt-free seasonings or herbs instead of table salt or sea salt.  Check with your health care provider or pharmacist before using salt substitutes.  Eat lower-sodium products, often labeled as "lower sodium" or "no salt added."  Eat fresh foods.  Eat more vegetables, fruits, and low-fat dairy products.  Choose whole grains. Look for the word "whole" as the first word in the ingredient list.  Choose fish and skinless chicken or [place] more often than red meat. Limit fish, poultry, and meat to 6 oz (170 g) each day.  Limit sweets, desserts, sugars, and sugary  drinks.  Choose heart-healthy fats.  Limit cheese to 1 oz (28 g) per day.  Eat more home-cooked food and less restaurant, buffet, and fast food.  Limit fried foods.  Cook foods using methods other than frying.  Limit canned vegetables. If you do use them, rinse them well to decrease the sodium.  When eating at a restaurant, ask that your food be prepared with less salt, or no salt if possible. WHAT FOODS CAN I EAT? Seek help from a dietitian for individual calorie needs. Grains Whole grain or whole wheat bread. Brown rice. Whole grain or whole wheat pasta. Quinoa, bulgur, and whole grain cereals. Low-sodium cereals. Corn or whole wheat flour tortillas. Whole grain cornbread. Whole grain crackers. Low-sodium crackers. Vegetables Fresh or frozen vegetables (raw, steamed, roasted, or grilled). Low-sodium or reduced-sodium tomato and vegetable juices. Low-sodium or reduced-sodium tomato sauce and paste. Low-sodium or reduced-sodium canned vegetables.  Fruits All fresh, canned (in natural juice), or frozen fruits. Meat and Other Protein Products Ground beef (85% or leaner), grass-fed beef, or beef trimmed of fat. Skinless chicken or [place]. Ground chicken or [place]. Pork trimmed of fat. All fish and seafood. Eggs. Dried beans, peas, or lentils. Unsalted nuts and seeds. Unsalted canned beans. Dairy Low-fat dairy products, such as skim or 1% milk, 2% or reduced-fat cheeses, low-fat ricotta or cottage cheese, or plain low-fat yogurt. Low-sodium or reduced-sodium cheeses. Fats and Oils Tub margarines without trans fats. Light or reduced-fat mayonnaise and salad dressings (reduced sodium). Avocado. Safflower, olive, or canola oils. Natural peanut or almond butter. Other Unsalted  popcorn and pretzels. The items listed above may not be a complete list of recommended foods or beverages. Contact your dietitian for more options. WHAT FOODS ARE NOT RECOMMENDED? Grains White bread. White pasta.  White rice. Refined cornbread. Bagels and croissants. Crackers that contain trans fat. Vegetables Creamed or fried vegetables. Vegetables in a cheese sauce. Regular canned vegetables. Regular canned tomato sauce and paste. Regular tomato and vegetable juices. Fruits Dried fruits. Canned fruit in light or heavy syrup. Fruit juice. Meat and Other Protein Products Fatty cuts of meat. Ribs, chicken wings, bacon, sausage, bologna, salami, chitterlings, fatback, hot dogs, bratwurst, and packaged luncheon meats. Salted nuts and seeds. Canned beans with salt. Dairy Whole or 2% milk, cream, half-and-half, and cream cheese. Whole-fat or sweetened yogurt. Full-fat cheeses or blue cheese. Nondairy creamers and whipped toppings. Processed cheese, cheese spreads, or cheese curds. Condiments Onion and garlic salt, seasoned salt, table salt, and sea salt. Canned and packaged gravies. Worcestershire sauce. Tartar sauce. Barbecue sauce. Teriyaki sauce. Soy sauce, including reduced sodium. Steak sauce. Fish sauce. Oyster sauce. Cocktail sauce. Horseradish. Ketchup and mustard. Meat flavorings and tenderizers. Bouillon cubes. Hot sauce. Tabasco sauce. Marinades. Taco seasonings. Relishes. Fats and Oils Butter, stick margarine, lard, shortening, ghee, and bacon fat. Coconut, palm kernel, or palm oils. Regular salad dressings. Other Pickles and olives. Salted popcorn and pretzels. The items listed above may not be a complete list of foods and beverages to avoid. Contact your dietitian for more information. WHERE CAN I FIND MORE INFORMATION? National Heart, Lung, and Blood Institute: travelstabloid.com   This information is not intended to replace advice given to you by your health care provider. Make sure you discuss any questions you have with your health care provider.   Document Released: 08/23/2011 Document Revised: 09/24/2014 Document Reviewed: 07/08/2013 Elsevier Interactive  Patient Education [employer].

## 2016-05-14 NOTE — Progress Notes (Signed)
Impression and Recommendations:    1. Elevated blood pressure (not hypertension)   2. Overweight (BMI 25.0-29.9)   3. Vitamin D insufficiency   4. Routine general medical examination at a health care facility   5. Screening for colorectal cancer      HTN: Blood pressures are consistent with elevated blood pressure not hypertension. They are all within goal of less than 150/90 on a regular basis. Patient will continue dietary and lifestyle modifications. Encouraged to increase cardiovascular activity to at least 30 minutes 5 days per week. Handouts provided. Also encouraged DASH diet     HLD: We will need to obtain fasting lipid profile and other yearly blood work today. Handouts provided.  Encouraged some weight loss through diet and exercise    we will obtain vitamin D levels.   Patient needs colonoscopy referral which we placed today.    patient will let us know about bone density screening, when her last one was and where she would like to go for that.  Routine labs ordered today and we will discuss at follow-up office visit when patient comes in for complete physical exam. Patient is up-to-date on her mammogram and we will refer her for colonoscopy today   Patient's Medications  New Prescriptions   No medications on file  Previous Medications   ASPIRIN EC 81 MG TABLET    Take 81 mg by mouth daily.   BIOTIN PO    Take 1 tablet by mouth daily.   CALCIUM CARBONATE (OS-CAL - DOSED IN MG OF ELEMENTAL CALCIUM) 1250 (500 CA) MG TABLET    Take 1 tablet by mouth daily with breakfast.   IBUPROFEN (ADVIL,MOTRIN) 400 MG TABLET    Take 1 tablet (400 mg total) by mouth every 6 (six) hours as needed for mild pain or moderate pain.  Modified Medications   No medications on file  Discontinued Medications   No medications on file    Return in about 4 weeks (around 06/11/2016) for Return to clinic to discuss labs and have CPE.  The patient was counseled, risk factors were discussed,  anticipatory guidance given.  Gross side effects, risk and benefits, and alternatives of medications discussed with patient.  Patient is aware that all medications have potential side effects and we are unable to predict every side effect or drug-drug interaction that may occur.  Expresses verbal understanding and consents to current therapy plan and treatment regimen.  Please see AVS handed out to patient at the end of our visit for further patient instructions/ counseling done pertaining to today's office visit.    Note: This document was prepared using Dragon voice recognition software and may include unintentional dictation errors.     Subjective:    Chief Complaint  Patient presents with  . Hypertension    HPI: Joann Wall is a 70 y.o. female who [facility] today for follow up for HTN.    Went to [facility] - in June.  Gets yrly skin screenings- Dr Delman Cheadle.    Pt is fasting today - we will get blood work.   Home BP readings have been running an ave145/ 72 and no highs or lows.  No sx.  SHe checked almost every day and brought in log today.  Denies HA, dizziness, CP, SOB, Visual changes, increasing pedal edema.     Preventitive Healthcare: Does water aerobics at the  tw [facility] weekly in our otherwise no walking etc.  Exercise: yes, some    Diet:  relatively healthy  Salt Restrition: most of the time   Wt Readings from Last 3 Encounters:  05/14/16 151 lb 1.6 oz (68.5 kg)  04/09/16 148 lb 14.4 oz (67.5 kg)  04/05/16 150 lb (68 kg)    BP Readings from Last 3 Encounters:  05/14/16 (!) 150/83  04/09/16 (!) 163/82  04/05/16 158/73    Pulse Readings from Last 3 Encounters:  05/14/16 61  04/09/16 (!) 40  04/05/16 71    No results found for: CREATININE, BUN, NA, K, CL, CO2  No results found for: CHOL  No results found for: HDL  No results found for: LDLCALC  No results found for: TRIG  No results found for:  CHOLHDL  No results found for: LDLDIRECT ===================================================================  Patient Active Problem List   Diagnosis Date Noted  . Elevated blood pressure (not hypertension) 04/09/2016    Priority: High  . Overweight (BMI 25.0-29.9) 04/09/2016    Priority: High  . Vitamin D insufficiency 04/09/2016    Priority: Medium  . S/P bilateral breast reduction- 2000 04/09/2016  . Status post left knee replacement- 2004 04/09/2016  . Contusion, R foot- sees dr Delilah Shan- [facility] 04/09/2016    Past Medical History:  Diagnosis Date  . Contusion, R foot- sees dr Delilah Shan- [facility] 04/09/2016  . Elevated blood pressure (not hypertension) 04/09/2016  . Overweight (BMI 25.0-29.9) 04/09/2016  . S/P bilateral breast reduction- 2000 04/09/2016  . Status post left knee replacement- 2004 04/09/2016  . Vitamin D insufficiency 04/09/2016    Past Surgical History:  Procedure Laterality Date  . BREAST SURGERY Bilateral    reduction  . REPLACEMENT TOTAL KNEE Left     Family History  Problem Relation Age of Onset  . Stroke Mother   . COPD Father   . Obesity Sister   . Hypertension Sister   . Crohn's disease Brother   . Healthy Daughter   . Healthy Son   . Suicidality Brother     History  Drug Use No  ,  History  Alcohol Use  . 8.4 oz/week  . 14 Glasses of wine per week  ,  History  Smoking Status  . Never Smoker  Smokeless Tobacco  . Never Used  ,    Current Outpatient Prescriptions on File Prior to Visit  Medication Sig Dispense Refill  . aspirin EC 81 MG tablet Take 81 mg by mouth daily.    Marland Kitchen BIOTIN PO Take 1 tablet by mouth daily.    . calcium carbonate (OS-CAL - DOSED IN MG OF ELEMENTAL CALCIUM) 1250 (500 Ca) MG tablet Take 1 tablet by mouth daily with breakfast.    . ibuprofen (ADVIL,MOTRIN) 400 MG tablet Take 1 tablet (400 mg total) by mouth every 6 (six) hours as needed for mild pain or moderate pain. 30 tablet 0   No current  facility-administered medications on file prior to visit.     Allergies  Allergen Reactions  . Sulfa Antibiotics Other (See Comments)    disoriented      Review of Systems:  General:  Denies fever, chills, appetite changes, unexplained weight loss.  Respiratory: Denies SOB, DOE, cough, wheezing.  Cardiovascular: Denies chest pain, palpitations.  Gastrointestinal: Denies nausea, vomiting, diarrhea, abdominal pain.  Genitourinary: Denies dysuria, increased frequency, flank pain. Endocrine: Denies hot or cold intolerance, polyuria, polydipsia. Musculoskeletal: Denies myalgias, back pain, joint swelling, arthralgias, gait problems.  Skin: Denies pallor, rash, suspicious lesions.  Neurological: Denies dizziness,  seizures, syncope, unexplained weakness, lightheadedness, numbness and headaches.  Psychiatric/Behavioral: Denies mood changes, suicidal or homicidal ideations, hallucinations, sleep disturbances.    Objective:    Blood pressure (!) 151/79, pulse 61, weight 151 lb 1.6 oz (68.5 kg). Body mass index is 27.42 kg/m. General: Well Developed, well nourished, and in no acute distress.  HEENT: Normocephalic, atraumatic, pupils equal round reactive to light, neck supple, No carotid bruits no JVD Skin: Warm and dry, cap RF less 2 sec Cardiac: Regular rate and rhythm, S1, S2 WNL's, no murmurs rubs or gallops Respiratory: ECTA B/L, Not using accessory muscles, speaking in full sentences. NeuroM-Sk: Ambulates w/o assistance, moves ext * 4 w/o difficulty, sensation grossly intact.  Ext: trace edema b/l  Psych: No HI/SI, judgement and insight good, Euthymic mood. Full Affect.

## 2016-05-15 LAB — CBC WITH DIFFERENTIAL/PLATELET
Basophils Absolute: 43 cells/uL (ref 0–200)
Basophils Relative: 1 %
EOS PCT: 1 %
Eosinophils Absolute: 43 cells/uL (ref 15–500)
HCT: 40.6 % (ref 35.0–45.0)
HEMOGLOBIN: 13.6 g/dL (ref 11.7–15.5)
LYMPHS ABS: 1978 {cells}/uL (ref 850–3900)
Lymphocytes Relative: 46 %
MCH: 33.9 pg — ABNORMAL HIGH (ref 27.0–33.0)
MCHC: 33.5 g/dL (ref 32.0–36.0)
MCV: 101.2 fL — ABNORMAL HIGH (ref 80.0–100.0)
MONO ABS: 387 {cells}/uL (ref 200–950)
MPV: 9.7 fL (ref 7.5–12.5)
Monocytes Relative: 9 %
NEUTROS ABS: 1849 {cells}/uL (ref 1500–7800)
Neutrophils Relative %: 43 %
Platelets: 277 10*3/uL (ref 140–400)
RBC: 4.01 MIL/uL (ref 3.80–5.10)
RDW: 14.1 % (ref 11.0–15.0)
WBC: 4.3 10*3/uL (ref 3.8–10.8)

## 2016-05-15 LAB — COMPLETE METABOLIC PANEL WITH GFR
ALBUMIN: 4.2 g/dL (ref 3.6–5.1)
ALK PHOS: 84 U/L (ref 33–130)
ALT: 13 U/L (ref 6–29)
AST: 21 U/L (ref 10–35)
BILIRUBIN TOTAL: 0.7 mg/dL (ref 0.2–1.2)
BUN: 12 mg/dL (ref 7–25)
CO2: 33 mmol/L — ABNORMAL HIGH (ref 20–31)
CREATININE: 0.49 mg/dL — AB (ref 0.60–0.93)
Calcium: 10.1 mg/dL (ref 8.6–10.4)
Chloride: 100 mmol/L (ref 98–110)
GFR, Est African American: >89 mL/min (ref >=60)
GFR, Est Non African American: >89 mL/min (ref >=60)
Glucose, Bld: 99 mg/dL (ref 65–99)
Potassium: 5.5 mmol/L — ABNORMAL HIGH (ref 3.5–5.3)
Sodium: 142 mmol/L (ref 135–146)
TOTAL PROTEIN: 6.6 g/dL (ref 6.1–8.1)

## 2016-05-15 LAB — LIPID PANEL
Cholesterol: 224 mg/dL — ABNORMAL HIGH (ref 125–200)
HDL: 102 mg/dL (ref >=46)
LDL CALC: 107 mg/dL (ref <130)
TRIGLYCERIDES: 76 mg/dL (ref <150)
Total CHOL/HDL Ratio: 2.2 Ratio (ref <=5.0)
VLDL: 15 mg/dL (ref <30)

## 2016-05-15 LAB — HEMOGLOBIN A1C
Hgb A1c MFr Bld: 5 % (ref <5.7)
Mean Plasma Glucose: 97 mg/dL

## 2016-05-15 LAB — VITAMIN B12: Vitamin B-12: 228 pg/mL (ref 200–1100)

## 2016-05-15 LAB — TSH: TSH: 3.25 mIU/L

## 2016-05-15 LAB — VITAMIN D 25 HYDROXY (VIT D DEFICIENCY, FRACTURES): VIT D 25 HYDROXY: 40 ng/mL (ref 30–100)

## 2016-06-12 ENCOUNTER — Ambulatory Visit (INDEPENDENT_AMBULATORY_CARE_PROVIDER_SITE_OTHER): Payer: Medicare Other | Admitting: Family Medicine

## 2016-06-12 ENCOUNTER — Encounter: Payer: Self-pay | Admitting: Family Medicine

## 2016-06-12 VITALS — BP 145/68 | HR 72 | Ht 62.25 in | Wt 151.2 lb

## 2016-06-12 DIAGNOSIS — Z1211 Encounter for screening for malignant neoplasm of colon: Secondary | ICD-10-CM

## 2016-06-12 DIAGNOSIS — E538 Deficiency of other specified B group vitamins: Secondary | ICD-10-CM | POA: Diagnosis not present

## 2016-06-12 DIAGNOSIS — R0989 Other specified symptoms and signs involving the circulatory and respiratory systems: Secondary | ICD-10-CM

## 2016-06-12 DIAGNOSIS — Z13820 Encounter for screening for osteoporosis: Secondary | ICD-10-CM | POA: Diagnosis not present

## 2016-06-12 DIAGNOSIS — Z23 Encounter for immunization: Secondary | ICD-10-CM | POA: Diagnosis not present

## 2016-06-12 DIAGNOSIS — E559 Vitamin D deficiency, unspecified: Secondary | ICD-10-CM

## 2016-06-12 DIAGNOSIS — E7889 Other lipoprotein metabolism disorders: Secondary | ICD-10-CM

## 2016-06-12 NOTE — Patient Instructions (Signed)
Preventive Care for Adults, Female A healthy lifestyle and preventive care can promote health and wellness. Preventive health guidelines for women include the following key practices.  A routine yearly physical is a good way to check with your health care provider about your health and preventive screening. It is a chance to share any concerns and updates on your health and to receive a thorough exam.  Visit your dentist for a routine exam and preventive care every 6 months. Brush your teeth twice a day and floss once a day. Good oral hygiene prevents tooth decay and gum disease.  The frequency of eye exams is based on your age, health, family medical history, use of contact lenses, and other factors. Follow your health care provider's recommendations for frequency of eye exams.  Eat a healthy diet. Foods like vegetables, fruits, whole grains, low-fat dairy products, and lean protein foods contain the nutrients you need without too many calories. Decrease your intake of foods high in solid fats, added sugars, and salt. Eat the right amount of calories for you.Get information about a proper diet from your health care provider, if necessary.  Regular physical exercise is one of the most important things you can do for your health. Most adults should get at least 150 minutes of moderate-intensity exercise (any activity that increases your heart rate and causes you to sweat) each week. In addition, most adults need muscle-strengthening exercises on 2 or more days a week.  Maintain a healthy weight. The body mass index (BMI) is a screening tool to identify possible weight problems. It provides an estimate of body fat based on height and weight. Your health care provider can find your BMI and can help you achieve or maintain a healthy weight.For adults 20 years and older:  A BMI below 18.5 is considered underweight.  A BMI of 18.5 to 24.9 is normal.  A BMI of 25 to 29.9 is considered overweight.  A  BMI of 30 and above is considered obese.  Maintain normal blood lipids and cholesterol levels by exercising and minimizing your intake of saturated fat. Eat a balanced diet with plenty of fruit and vegetables. Blood tests for lipids and cholesterol should begin at age 45 and be repeated every 5 years. If your lipid or cholesterol levels are high, you are over 50, or you are at high risk for heart disease, you may need your cholesterol levels checked more frequently.Ongoing high lipid and cholesterol levels should be treated with medicines if diet and exercise are not working.  If you smoke, find out from your health care provider how to quit. If you do not use tobacco, do not start.  Lung cancer screening is recommended for adults aged 45-80 years who are at high risk for developing lung cancer because of a history of smoking. A yearly low-dose CT scan of the lungs is recommended for people who have at least a 30-pack-year history of smoking and are a current smoker or have quit within the past 15 years. A pack year of smoking is smoking an average of 1 pack of cigarettes a day for 1 year (for example: 1 pack a day for 30 years or 2 packs a day for 15 years). Yearly screening should continue until the smoker has stopped smoking for at least 15 years. Yearly screening should be stopped for people who develop a health problem that would prevent them from having lung cancer treatment.  If you are pregnant, do not drink alcohol. If you are  breastfeeding, be very cautious about drinking alcohol. If you are not pregnant and choose to drink alcohol, do not have more than 1 drink per day. One drink is considered to be 12 ounces (355 mL) of beer, 5 ounces (148 mL) of wine, or 1.5 ounces (44 mL) of liquor.  Avoid use of street drugs. Do not share needles with anyone. Ask for help if you need support or instructions about stopping the use of drugs.  High blood pressure causes heart disease and increases the risk  of stroke. Your blood pressure should be checked at least every 1 to 2 years. Ongoing high blood pressure should be treated with medicines if weight loss and exercise do not work.  If you are 55-79 years old, ask your health care provider if you should take aspirin to prevent strokes.  Diabetes screening is done by taking a blood sample to check your blood glucose level after you have not eaten for a certain period of time (fasting). If you are not overweight and you do not have risk factors for diabetes, you should be screened once every 3 years starting at age 45. If you are overweight or obese and you are 40-70 years of age, you should be screened for diabetes every year as part of your cardiovascular risk assessment.  Breast cancer screening is essential preventive care for women. You should practice "breast self-awareness." This means understanding the normal appearance and feel of your breasts and may include breast self-examination. Any changes detected, no matter how small, should be reported to a health care provider. Women in their 20s and 30s should have a clinical breast exam (CBE) by a health care provider as part of a regular health exam every 1 to 3 years. After age 40, women should have a CBE every year. Starting at age 40, women should consider having a mammogram (breast X-ray test) every year. Women who have a family history of breast cancer should talk to their health care provider about genetic screening. Women at a high risk of breast cancer should talk to their health care providers about having an MRI and a mammogram every year.  Breast cancer gene (BRCA)-related cancer risk assessment is recommended for women who have family members with BRCA-related cancers. BRCA-related cancers include breast, ovarian, tubal, and peritoneal cancers. Having family members with these cancers may be associated with an increased risk for harmful changes (mutations) in the breast cancer genes BRCA1 and  BRCA2. Results of the assessment will determine the need for genetic counseling and BRCA1 and BRCA2 testing.  Your health care provider may recommend that you be screened regularly for cancer of the pelvic organs (ovaries, uterus, and vagina). This screening involves a pelvic examination, including checking for microscopic changes to the surface of your cervix (Pap test). You may be encouraged to have this screening done every 3 years, beginning at age 21.  For women ages 30-65, health care providers may recommend pelvic exams and Pap testing every 3 years, or they may recommend the Pap and pelvic exam, combined with testing for human papilloma virus (HPV), every 5 years. Some types of HPV increase your risk of cervical cancer. Testing for HPV may also be done on women of any age with unclear Pap test results.  Other health care providers may not recommend any screening for nonpregnant women who are considered low risk for pelvic cancer and who do not have symptoms. Ask your health care provider if a screening pelvic exam is right for   you.  If you have had past treatment for cervical cancer or a condition that could lead to cancer, you need Pap tests and screening for cancer for at least 20 years after your treatment. If Pap tests have been discontinued, your risk factors (such as having a new sexual partner) need to be reassessed to determine if screening should resume. Some women have medical problems that increase the chance of getting cervical cancer. In these cases, your health care provider may recommend more frequent screening and Pap tests.  Colorectal cancer can be detected and often prevented. Most routine colorectal cancer screening begins at the age of 50 years and continues through age 75 years. However, your health care provider may recommend screening at an earlier age if you have risk factors for colon cancer. On a yearly basis, your health care provider may provide home test kits to check  for hidden blood in the stool. Use of a small camera at the end of a tube, to directly examine the colon (sigmoidoscopy or colonoscopy), can detect the earliest forms of colorectal cancer. Talk to your health care provider about this at age 50, when routine screening begins. Direct exam of the colon should be repeated every 5-10 years through age 75 years, unless early forms of precancerous polyps or small growths are found.  People who are at an increased risk for hepatitis B should be screened for this virus. You are considered at high risk for hepatitis B if:  You were born in a country where hepatitis B occurs often. Talk with your health care provider about which countries are considered high risk.  Your parents were born in a high-risk country and you have not received a shot to protect against hepatitis B (hepatitis B vaccine).  You have HIV or AIDS.  You use needles to inject street drugs.  You live with, or have sex with, someone who has hepatitis B.  You get hemodialysis treatment.  You take certain medicines for conditions like cancer, organ transplantation, and autoimmune conditions.  Hepatitis C blood testing is recommended for all people born from 1945 through 1965 and any individual with known risks for hepatitis C.  Practice safe sex. Use condoms and avoid high-risk sexual practices to reduce the spread of sexually transmitted infections (STIs). STIs include gonorrhea, chlamydia, syphilis, trichomonas, herpes, HPV, and human immunodeficiency virus (HIV). Herpes, HIV, and HPV are viral illnesses that have no cure. They can result in disability, cancer, and death.  You should be screened for sexually transmitted illnesses (STIs) including gonorrhea and chlamydia if:  You are sexually active and are younger than 24 years.  You are older than 24 years and your health care provider tells you that you are at risk for this type of infection.  Your sexual activity has changed  since you were last screened and you are at an increased risk for chlamydia or gonorrhea. Ask your health care provider if you are at risk.  If you are at risk of being infected with HIV, it is recommended that you take a prescription medicine daily to prevent HIV infection. This is called preexposure prophylaxis (PrEP). You are considered at risk if:  You are sexually active and do not regularly use condoms or know the HIV status of your partner(s).  You take drugs by injection.  You are sexually active with a partner who has HIV.  Talk with your health care provider about whether you are at high risk of being infected with HIV. If   you choose to begin PrEP, you should first be tested for HIV. You should then be tested every 3 months for as long as you are taking PrEP.  Osteoporosis is a disease in which the bones lose minerals and strength with aging. This can result in serious bone fractures or breaks. The risk of osteoporosis can be identified using a bone density scan. Women ages 67 years and over and women at risk for fractures or osteoporosis should discuss screening with their health care providers. Ask your health care provider whether you should take a calcium supplement or vitamin D to reduce the rate of osteoporosis.  Menopause can be associated with physical symptoms and risks. Hormone replacement therapy is available to decrease symptoms and risks. You should talk to your health care provider about whether hormone replacement therapy is right for you.  Use sunscreen. Apply sunscreen liberally and repeatedly throughout the day. You should seek shade when your shadow is shorter than you. Protect yourself by wearing long sleeves, pants, a wide-brimmed hat, and sunglasses year round, whenever you are outdoors.  Once a month, do a whole body skin exam, using a mirror to look at the skin on your back. Tell your health care provider of new moles, moles that have irregular borders, moles that  are larger than a pencil eraser, or moles that have changed in shape or color.  Stay current with required vaccines (immunizations).  Influenza vaccine. All adults should be immunized every year.  Tetanus, diphtheria, and acellular pertussis (Td, Tdap) vaccine. Pregnant women should receive 1 dose of Tdap vaccine during each pregnancy. The dose should be obtained regardless of the length of time since the last dose. Immunization is preferred during the 27th-36th week of gestation. An adult who has not previously received Tdap or who does not know her vaccine status should receive 1 dose of Tdap. This initial dose should be followed by tetanus and diphtheria toxoids (Td) booster doses every 10 years. Adults with an unknown or incomplete history of completing a 3-dose immunization series with Td-containing vaccines should begin or complete a primary immunization series including a Tdap dose. Adults should receive a Td booster every 10 years.  Varicella vaccine. An adult without evidence of immunity to varicella should receive 2 doses or a second dose if she has previously received 1 dose. Pregnant females who do not have evidence of immunity should receive the first dose after pregnancy. This first dose should be obtained before leaving the health care facility. The second dose should be obtained 4-8 weeks after the first dose.  Human papillomavirus (HPV) vaccine. Females aged 13-26 years who have not received the vaccine previously should obtain the 3-dose series. The vaccine is not recommended for use in pregnant females. However, pregnancy testing is not needed before receiving a dose. If a female is found to be pregnant after receiving a dose, no treatment is needed. In that case, the remaining doses should be delayed until after the pregnancy. Immunization is recommended for any person with an immunocompromised condition through the age of 61 years if she did not get any or all doses earlier. During the  3-dose series, the second dose should be obtained 4-8 weeks after the first dose. The third dose should be obtained 24 weeks after the first dose and 16 weeks after the second dose.  Zoster vaccine. One dose is recommended for adults aged 30 years or older unless certain conditions are present.  Measles, mumps, and rubella (MMR) vaccine. Adults born  before 1957 generally are considered immune to measles and mumps. Adults born in 1957 or later should have 1 or more doses of MMR vaccine unless there is a contraindication to the vaccine or there is laboratory evidence of immunity to each of the three diseases. A routine second dose of MMR vaccine should be obtained at least 28 days after the first dose for students attending postsecondary schools, health care workers, or international travelers. People who received inactivated measles vaccine or an unknown type of measles vaccine during 1963-1967 should receive 2 doses of MMR vaccine. People who received inactivated mumps vaccine or an unknown type of mumps vaccine before 1979 and are at high risk for mumps infection should consider immunization with 2 doses of MMR vaccine. For females of childbearing age, rubella immunity should be determined. If there is no evidence of immunity, females who are not pregnant should be vaccinated. If there is no evidence of immunity, females who are pregnant should delay immunization until after pregnancy. Unvaccinated health care workers born before 1957 who lack laboratory evidence of measles, mumps, or rubella immunity or laboratory confirmation of disease should consider measles and mumps immunization with 2 doses of MMR vaccine or rubella immunization with 1 dose of MMR vaccine.  Pneumococcal 13-valent conjugate (PCV13) vaccine. When indicated, a person who is uncertain of his immunization history and has no record of immunization should receive the PCV13 vaccine. All adults 65 years of age and older should receive this  vaccine. An adult aged 19 years or older who has certain medical conditions and has not been previously immunized should receive 1 dose of PCV13 vaccine. This PCV13 should be followed with a dose of pneumococcal polysaccharide (PPSV23) vaccine. Adults who are at high risk for pneumococcal disease should obtain the PPSV23 vaccine at least 8 weeks after the dose of PCV13 vaccine. Adults older than 71 years of age who have normal immune system function should obtain the PPSV23 vaccine dose at least 1 year after the dose of PCV13 vaccine.  Pneumococcal polysaccharide (PPSV23) vaccine. When PCV13 is also indicated, PCV13 should be obtained first. All adults aged 65 years and older should be immunized. An adult younger than age 65 years who has certain medical conditions should be immunized. Any person who resides in a nursing home or long-term care facility should be immunized. An adult smoker should be immunized. People with an immunocompromised condition and certain other conditions should receive both PCV13 and PPSV23 vaccines. People with human immunodeficiency virus (HIV) infection should be immunized as soon as possible after diagnosis. Immunization during chemotherapy or radiation therapy should be avoided. Routine use of PPSV23 vaccine is not recommended for American Indians, [place] Natives, or people younger than 65 years unless there are medical conditions that require PPSV23 vaccine. When indicated, people who have unknown immunization and have no record of immunization should receive PPSV23 vaccine. One-time revaccination 5 years after the first dose of PPSV23 is recommended for people aged 19-64 years who have chronic kidney failure, nephrotic syndrome, asplenia, or immunocompromised conditions. People who received 1-2 doses of PPSV23 before age 65 years should receive another dose of PPSV23 vaccine at age 65 years or later if at least 5 years have passed since the previous dose. Doses of PPSV23 are not  needed for people immunized with PPSV23 at or after age 65 years.  Meningococcal vaccine. Adults with asplenia or persistent complement component deficiencies should receive 2 doses of quadrivalent meningococcal conjugate (MenACWY-D) vaccine. The doses should be obtained   at least 2 months apart. Microbiologists working with certain meningococcal bacteria, [place] recruits, people at risk during an outbreak, and people who travel to or live in countries with a high rate of meningitis should be immunized. A first-year college student up through age 34 years who is living in a residence hall should receive a dose if she did not receive a dose on or after her 16th birthday. Adults who have certain high-risk conditions should receive one or more doses of vaccine.  Hepatitis A vaccine. Adults who wish to be protected from this disease, have certain high-risk conditions, work with hepatitis A-infected animals, work in hepatitis A research labs, or travel to or work in countries with a high rate of hepatitis A should be immunized. Adults who were previously unvaccinated and who anticipate close contact with an international adoptee during the first 60 days after arrival in the [place] from a country with a high rate of hepatitis A should be immunized.  Hepatitis B vaccine. Adults who wish to be protected from this disease, have certain high-risk conditions, may be exposed to blood or other infectious body fluids, are household contacts or sex partners of hepatitis B positive people, are clients or workers in certain care facilities, or travel to or work in countries with a high rate of hepatitis B should be immunized.  Haemophilus influenzae type b (Hib) vaccine. A previously unvaccinated person with asplenia or sickle cell disease or having a scheduled splenectomy should receive 1 dose of Hib vaccine. Regardless of previous immunization, a recipient of a hematopoietic stem cell transplant should receive a  3-dose series 6-12 months after her successful transplant. Hib vaccine is not recommended for adults with HIV infection. Preventive Services / Frequency Ages 35 to 4 years  Blood pressure check.** / Every 3-5 years.  Lipid and cholesterol check.** / Every 5 years beginning at age 60.  Clinical breast exam.** / Every 3 years for women in their 71s and 10s.  BRCA-related cancer risk assessment.** / For women who have family members with a BRCA-related cancer (breast, ovarian, tubal, or peritoneal cancers).  Pap test.** / Every 2 years from ages 76 through 26. Every 3 years starting at age 61 through age 76 or 93 with a history of 3 consecutive normal Pap tests.  HPV screening.** / Every 3 years from ages 37 through ages 60 to 51 with a history of 3 consecutive normal Pap tests.  Hepatitis C blood test.** / For any individual with known risks for hepatitis C.  Skin self-exam. / Monthly.  Influenza vaccine. / Every year.  Tetanus, diphtheria, and acellular pertussis (Tdap, Td) vaccine.** / Consult your health care provider. Pregnant women should receive 1 dose of Tdap vaccine during each pregnancy. 1 dose of Td every 10 years.  Varicella vaccine.** / Consult your health care provider. Pregnant females who do not have evidence of immunity should receive the first dose after pregnancy.  HPV vaccine. / 3 doses over 6 months, if 93 and younger. The vaccine is not recommended for use in pregnant females. However, pregnancy testing is not needed before receiving a dose.  Measles, mumps, rubella (MMR) vaccine.** / You need at least 1 dose of MMR if you were born in 1957 or later. You may also need a 2nd dose. For females of childbearing age, rubella immunity should be determined. If there is no evidence of immunity, females who are not pregnant should be vaccinated. If there is no evidence of immunity, females who are  pregnant should delay immunization until after pregnancy.  Pneumococcal  13-valent conjugate (PCV13) vaccine.** / Consult your health care provider.  Pneumococcal polysaccharide (PPSV23) vaccine.** / 1 to 2 doses if you smoke cigarettes or if you have certain conditions.  Meningococcal vaccine.** / 1 dose if you are age 68 to 8 years and a Market researcher living in a residence hall, or have one of several medical conditions, you need to get vaccinated against meningococcal disease. You may also need additional booster doses.  Hepatitis A vaccine.** / Consult your health care provider.  Hepatitis B vaccine.** / Consult your health care provider.  Haemophilus influenzae type b (Hib) vaccine.** / Consult your health care provider. Ages 7 to 53 years  Blood pressure check.** / Every year.  Lipid and cholesterol check.** / Every 5 years beginning at age 25 years.  Lung cancer screening. / Every year if you are aged 11-80 years and have a 30-pack-year history of smoking and currently smoke or have quit within the past 15 years. Yearly screening is stopped once you have quit smoking for at least 15 years or develop a health problem that would prevent you from having lung cancer treatment.  Clinical breast exam.** / Every year after age 48 years.  BRCA-related cancer risk assessment.** / For women who have family members with a BRCA-related cancer (breast, ovarian, tubal, or peritoneal cancers).  Mammogram.** / Every year beginning at age 41 years and continuing for as long as you are in good health. Consult with your health care provider.  Pap test.** / Every 3 years starting at age 65 years through age 37 or 70 years with a history of 3 consecutive normal Pap tests.  HPV screening.** / Every 3 years from ages 72 years through ages 60 to 40 years with a history of 3 consecutive normal Pap tests.  Fecal occult blood test (FOBT) of stool. / Every year beginning at age 21 years and continuing until age 5 years. You may not need to do this test if you get  a colonoscopy every 10 years.  Flexible sigmoidoscopy or colonoscopy.** / Every 5 years for a flexible sigmoidoscopy or every 10 years for a colonoscopy beginning at age 35 years and continuing until age 48 years.  Hepatitis C blood test.** / For all people born from [birth year] through 1965 and any individual with known risks for hepatitis C.  Skin self-exam. / Monthly.  Influenza vaccine. / Every year.  Tetanus, diphtheria, and acellular pertussis (Tdap/Td) vaccine.** / Consult your health care provider. Pregnant women should receive 1 dose of Tdap vaccine during each pregnancy. 1 dose of Td every 10 years.  Varicella vaccine.** / Consult your health care provider. Pregnant females who do not have evidence of immunity should receive the first dose after pregnancy.  Zoster vaccine.** / 1 dose for adults aged 30 years or older.  Measles, mumps, rubella (MMR) vaccine.** / You need at least 1 dose of MMR if you were born in 1957 or later. You may also need a second dose. For females of childbearing age, rubella immunity should be determined. If there is no evidence of immunity, females who are not pregnant should be vaccinated. If there is no evidence of immunity, females who are pregnant should delay immunization until after pregnancy.  Pneumococcal 13-valent conjugate (PCV13) vaccine.** / Consult your health care provider.  Pneumococcal polysaccharide (PPSV23) vaccine.** / 1 to 2 doses if you smoke cigarettes or if you have certain conditions.  Meningococcal vaccine.** /  Consult your health care provider.  Hepatitis A vaccine.** / Consult your health care provider.  Hepatitis B vaccine.** / Consult your health care provider.  Haemophilus influenzae type b (Hib) vaccine.** / Consult your health care provider. Ages 64 years and over  Blood pressure check.** / Every year.  Lipid and cholesterol check.** / Every 5 years beginning at age 23 years.  Lung cancer screening. / Every year if you  are aged 16-80 years and have a 30-pack-year history of smoking and currently smoke or have quit within the past 15 years. Yearly screening is stopped once you have quit smoking for at least 15 years or develop a health problem that would prevent you from having lung cancer treatment.  Clinical breast exam.** / Every year after age 74 years.  BRCA-related cancer risk assessment.** / For women who have family members with a BRCA-related cancer (breast, ovarian, tubal, or peritoneal cancers).  Mammogram.** / Every year beginning at age 44 years and continuing for as long as you are in good health. Consult with your health care provider.  Pap test.** / Every 3 years starting at age 58 years through age 22 or 39 years with 3 consecutive normal Pap tests. Testing can be stopped between 65 and 70 years with 3 consecutive normal Pap tests and no abnormal Pap or HPV tests in the past 10 years.  HPV screening.** / Every 3 years from ages 64 years through ages 70 or 61 years with a history of 3 consecutive normal Pap tests. Testing can be stopped between 65 and 70 years with 3 consecutive normal Pap tests and no abnormal Pap or HPV tests in the past 10 years.  Fecal occult blood test (FOBT) of stool. / Every year beginning at age 40 years and continuing until age 27 years. You may not need to do this test if you get a colonoscopy every 10 years.  Flexible sigmoidoscopy or colonoscopy.** / Every 5 years for a flexible sigmoidoscopy or every 10 years for a colonoscopy beginning at age 7 years and continuing until age 32 years.  Hepatitis C blood test.** / For all people born from [birth year] through 1966 and any individual with known risks for hepatitis C.  Osteoporosis screening.** / A one-time screening for women ages 30 years and over and women at risk for fractures or osteoporosis.  Skin self-exam. / Monthly.  Influenza vaccine. / Every year.  Tetanus, diphtheria, and acellular pertussis (Tdap/Td)  vaccine.** / 1 dose of Td every 10 years.  Varicella vaccine.** / Consult your health care provider.  Zoster vaccine.** / 1 dose for adults aged 35 years or older.  Pneumococcal 13-valent conjugate (PCV13) vaccine.** / Consult your health care provider.  Pneumococcal polysaccharide (PPSV23) vaccine.** / 1 dose for all adults aged 46 years and older.  Meningococcal vaccine.** / Consult your health care provider.  Hepatitis A vaccine.** / Consult your health care provider.  Hepatitis B vaccine.** / Consult your health care provider.  Haemophilus influenzae type b (Hib) vaccine.** / Consult your health care provider. ** Family history and personal history of risk and conditions may change your health care provider's recommendations.   This information is not intended to replace advice given to you by your health care provider. Make sure you discuss any questions you have with your health care provider.   Document Released: 10/30/2001 Document Revised: 09/24/2014 Document Reviewed: 01/29/2011 Elsevier Interactive Patient Education [employer].

## 2016-06-12 NOTE — Progress Notes (Signed)
Impression and Recommendations:    1. Special screening for malignant neoplasms, colon   2. Need for Tdap vaccination   3. Need for prophylactic vaccination against Streptococcus pneumoniae (pneumococcus)   4. Screening for osteoporosis   5. Need for prophylactic vaccination and inoculation against influenza   6. h/o mild B12 nutritional deficiency   7. Elevated HDL   8. Vitamin D insufficiency     Please see orders section below for further details of actions taken during this office visit.  Gross side effects, risk and benefits, and alternatives of medications discussed with patient.  Patient is aware that all medications have potential side effects and we are unable to predict every side effect or drug-drug interaction that may occur.  Expresses verbal understanding and consents to current therapy plan and treatment regiment.  1) Anticipatory Guidance: Discussed importance of wearing a seatbelt while driving, not texting while driving; sunscreen when outside along with yearly skin surveillance; eating a well balanced and modest diet; physical activity at least 25 minutes per day or 150 min/ week of moderate to intense activity.  2) Immunizations / Screenings / Labs:  All immunizations and screenings that patient agrees to, are up-to-date per recommendations or will be updated today.  Patient understands the needs for q 65modental and yearly vision screens which pt will schedule independently.   - We reviewed all of her labs today.   - She is to continue B12 supplements yet increase dose to at least double. Continue vitamin D supplementation.   - HDL is excellent at over 100. Explained importance of continuing to exercise and be active. - Prudent diet discussed  3) Weight:     Improve nutrient density of diet through increasing intake of fruits and vegetables and decreasing saturated/trans fats, white flour products and refined sugar products.   F-up preventative CPE in 1 year.  F/up sooner for chronic care management as discussed and/or prn.  Orders Placed This Encounter  Procedures  . DG Bone Density  . Tdap vaccine greater than or equal to 7yo IM  . Flu Vaccine QUAD 36+ mos IM  . Ambulatory referral to Gastroenterology    Please see orders placed and AVS handed out to patient at the end of our visit for further patient instructions/ counseling done pertaining to today's office visit.     Subjective:    Chief Complaint  Patient presents with  . Annual Exam   CC: CPE and review of labs  HPI: Joann Wall a 70y.o. female [facility].  Health Maintenance Summary Reviewed and updated, unless pt declines services.  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 D/c pt Q 2 yrs needed   Wt Readings from Last 3 Encounters:  06/12/16 151 lb 3.2 oz (68.6 kg)  05/14/16 151 lb 1.6 oz (68.5 kg)  04/09/16 148 lb 14.4 oz (67.5 kg)   BP Readings from  Last 3 Encounters:  06/12/16 (!) 145/68  05/14/16 (!) 150/83  04/09/16 (!) 163/82   Pulse Readings from Last 3 Encounters:  06/12/16 72  05/14/16 61  04/09/16 (!) 40     Past Medical History:  Diagnosis Date  . Contusion, R foot- sees dr Delilah Shan- [facility] 04/09/2016  . Elevated blood pressure (not hypertension) 04/09/2016  . Overweight (BMI 25.0-29.9) 04/09/2016  . S/P bilateral breast reduction- 2000 04/09/2016  . Status post left knee replacement- 2004 04/09/2016  . Vitamin D insufficiency 04/09/2016      Past Surgical History:  Procedure Laterality Date  . BREAST SURGERY Bilateral    reduction    . REPLACEMENT TOTAL KNEE Left       Family History  Problem Relation Age of Onset  . Stroke Mother   . COPD Father   . Obesity Sister   . Hypertension Sister   . Crohn's disease Brother   . Healthy Daughter   . Healthy Son   . Suicidality Brother       History  Drug Use No  ,   History  Alcohol Use  . 8.4 oz/week  . 14 Glasses of wine per week  ,   History  Smoking Status  . Never Smoker  Smokeless Tobacco  . Never Used  ,   History  Sexual Activity  . Sexual activity: No    Current Outpatient Prescriptions on File Prior to Visit  Medication Sig Dispense Refill  . aspirin EC 81 MG tablet Take 81 mg by mouth daily.    Marland Kitchen BIOTIN PO Take 1 tablet by mouth daily.    . calcium carbonate (OS-CAL - DOSED IN MG OF ELEMENTAL CALCIUM) 1250 (500 Ca) MG tablet Take 1 tablet by mouth daily with breakfast.    . ibuprofen (ADVIL,MOTRIN) 400 MG tablet Take 1 tablet (400 mg total) by mouth every 6 (six) hours as needed for mild pain or moderate pain. 30 tablet 0   No current facility-administered medications on file prior to visit.     Allergies: Sulfa antibiotics  Review of Systems  Constitutional: Negative.  Negative for chills, diaphoresis, fever, malaise/fatigue and weight loss.  HENT: Negative.  Negative for congestion, sore throat and tinnitus.   Eyes: Negative.  Negative for blurred vision, double vision and photophobia.  Respiratory: Negative.  Negative for cough and wheezing.   Cardiovascular: Negative.  Negative for chest pain and palpitations.  Gastrointestinal: Negative.  Negative for blood in stool, diarrhea, nausea and vomiting.  Genitourinary: Negative.  Negative for dysuria, frequency and urgency.  Musculoskeletal: Positive for joint pain. Negative for myalgias.       Persistent right foot pain, has seen ortho for this problem, onset x 2 years ago  Skin: Negative.  Negative for itching and rash.  Neurological: Negative.  Negative for dizziness, focal  weakness, weakness and headaches.  Endo/Heme/Allergies: Negative.  Negative for environmental allergies and polydipsia. Does not bruise/bleed easily.  Psychiatric/Behavioral: Negative.  Negative for depression and memory loss. The patient is not nervous/anxious and does not have insomnia.      Objective:    Blood pressure (!) 145/68, pulse 72, height 5' 2.25" (1.581 m), weight 151 lb 3.2 oz (68.6 kg). Body mass index is 27.43 kg/m. General Appearance:    Alert, cooperative, no distress, appears stated age  Head:    Normocephalic, without obvious abnormality, atraumatic  Eyes:    PERRL, conjunctiva/corneas clear, EOM's intact, fundi    benign, both eyes  Ears:  Normal TM's and external ear canals, both ears  Nose:   Nares normal, septum midline, mucosa normal, no drainage    or sinus tenderness  Throat:   Lips w/o lesion, mucosa moist, and tongue normal; teeth and   gums normal  Neck:   Supple, symmetrical, trachea midline, no adenopathy;    thyroid:  no enlargement/tenderness/nodules; no carotid   bruit or JVD  Back:     Symmetric, no curvature, ROM normal, no CVA tenderness  Lungs:     Clear to auscultation bilaterally, respirations unlabored, no       Wh/ R/ R  Chest Wall:    No tenderness or gross deformity; normal excursion   Heart:    Regular rate and rhythm, S1 and S2 normal, no murmur, rub   or gallop  Breast Exam:    No tenderness, masses, or nipple abnormality b/l; no d/c  Abdomen:     Soft, non-tender, bowel sounds active all four quadrants, NO   G/R/R, no masses, no organomegaly  Genitalia:    deferred   Rectal:    deferred  Extremities:   Extremities normal, atraumatic, no cyanosis or gross edema  Pulses:   2+ and symmetric all extremities  Skin:   Warm, dry, Skin color, texture, turgor normal, no obvious rashes or lesions Psych: No HI/SI, judgement and insight good, Euthymic mood. Full Affect.  Neurologic:   CNII-XII intact, normal strength, sensation and reflexes     Throughout    Recent Results (from the past 2160 hour(s))  CBC with Differential/Platelet     Status: Abnormal   Collection Time: 05/14/16  8:39 AM  Result Value Ref Range   WBC 4.3 3.8 - 10.8 K/uL   RBC 4.01 3.80 - 5.10 MIL/uL   Hemoglobin 13.6 11.7 - 15.5 g/dL   HCT 40.6 35.0 - 45.0 %   MCV 101.2 (H) 80.0 - 100.0 fL   MCH 33.9 (H) 27.0 - 33.0 pg   MCHC 33.5 32.0 - 36.0 g/dL   RDW 14.1 11.0 - 15.0 %   Platelets 277 140 - 400 K/uL   MPV 9.7 7.5 - 12.5 fL   Neutro Abs 1,849 1,500 - 7,800 cells/uL   Lymphs Abs 1,978 850 - 3,900 cells/uL   Monocytes Absolute 387 200 - 950 cells/uL   Eosinophils Absolute 43 15 - 500 cells/uL   Basophils Absolute 43 0 - 200 cells/uL   Neutrophils Relative % 43 %   Lymphocytes Relative 46 %   Monocytes Relative 9 %   Eosinophils Relative 1 %   Basophils Relative 1 %   Smear Review Criteria for review not met   COMPLETE METABOLIC PANEL WITH GFR     Status: Abnormal   Collection Time: 05/14/16  8:39 AM  Result Value Ref Range   Sodium 142 135 - 146 mmol/L   Potassium 5.5 (H) 3.5 - 5.3 mmol/L    Comment: No visible hemolysis.   Chloride 100 98 - 110 mmol/L   CO2 33 (H) 20 - 31 mmol/L   Glucose, Bld 99 65 - 99 mg/dL   BUN 12 7 - 25 mg/dL   Creat 0.49 (L) 0.60 - 0.93 mg/dL    Comment:   For patients > or = 71 years of age: The upper reference limit for Creatinine is approximately 13% higher for people identified as African-American.      Total Bilirubin 0.7 0.2 - 1.2 mg/dL   Alkaline Phosphatase 84 33 - 130 U/L   AST 21  10 - 35 U/L   ALT 13 6 - 29 U/L   Total Protein 6.6 6.1 - 8.1 g/dL   Albumin 4.2 3.6 - 5.1 g/dL   Calcium 10.1 8.6 - 10.4 mg/dL   GFR, Est African American >89 >=60 mL/min   GFR, Est Non African American >89 >=60 mL/min  Hemoglobin A1c     Status: None   Collection Time: 05/14/16  8:39 AM  Result Value Ref Range   Hgb A1c MFr Bld 5.0 <5.7 %    Comment:   For the purpose of screening for the presence of diabetes:     <5.7%       Consistent with the absence of diabetes 5.7-6.4 %   Consistent with increased risk for diabetes (prediabetes) >=6.5 %     Consistent with diabetes   This assay result is consistent with a decreased risk of diabetes.   Currently, no consensus exists regarding use of hemoglobin A1c for diagnosis of diabetes in children.   According to American Diabetes Association (ADA) guidelines, hemoglobin A1c <7.0% represents optimal control in non-pregnant diabetic patients. Different metrics may apply to specific patient populations. Standards of Medical Care in Diabetes (ADA).      Mean Plasma Glucose 97 mg/dL  Lipid panel     Status: Abnormal   Collection Time: 05/14/16  8:39 AM  Result Value Ref Range   Cholesterol 224 (H) 125 - 200 mg/dL   Triglycerides 76 <150 mg/dL   HDL 102 >=46 mg/dL   Total CHOL/HDL Ratio 2.2 <=5.0 Ratio   VLDL 15 <30 mg/dL   LDL Cholesterol 107 <130 mg/dL    Comment:   Total Cholesterol/HDL Ratio:CHD Risk                        Coronary Heart Disease Risk Table                                        Men       Women          1/2 Average Risk              3.4        3.3              Average Risk              5.0        4.4           2X Average Risk              9.6        7.1           3X Average Risk             23.4       11.0 Use the calculated Patient Ratio above and the CHD Risk table  to determine the patient's CHD Risk.   TSH     Status: None   Collection Time: 05/14/16  8:39 AM  Result Value Ref Range   TSH 3.25 mIU/L    Comment:   Reference Range   > or = 20 Years  0.40-4.50   Pregnancy Range First trimester  0.26-2.66 Second trimester 0.55-2.73 Third trimester  0.43-2.91     Vitamin B12     Status: None   Collection Time: 05/14/16  8:39 AM  Result Value Ref Range   Vitamin B-12 228 200 - 1,100 pg/mL  VITAMIN D 25 Hydroxy (Vit-D Deficiency, Fractures)     Status: None   Collection Time: 05/14/16  8:39 AM  Result Value Ref  Range   Vit D, 25-Hydroxy 40 30 - 100 ng/mL    Comment: Vitamin D Status           25-OH Vitamin D        Deficiency                <20 ng/mL        Insufficiency         20 - 29 ng/mL        Optimal             > or = 30 ng/mL   For 25-OH Vitamin D testing on patients on D2-supplementation and patients for whom quantitation of D2 and D3 fractions is required, the QuestAssureD 25-OH VIT D, (D2,D3), LC/MS/MS is recommended: order code [id number] (patients > 2 yrs).

## 2016-06-13 ENCOUNTER — Encounter: Payer: Self-pay | Admitting: Gastroenterology

## 2016-06-16 DIAGNOSIS — E7889 Other lipoprotein metabolism disorders: Secondary | ICD-10-CM | POA: Insufficient documentation

## 2016-07-24 ENCOUNTER — Ambulatory Visit (AMBULATORY_SURGERY_CENTER): Payer: Self-pay

## 2016-07-24 DIAGNOSIS — Z1211 Encounter for screening for malignant neoplasm of colon: Secondary | ICD-10-CM

## 2016-07-24 MED ORDER — NA SULFATE-K SULFATE-MG SULF 17.5-3.13-1.6 GM/177ML PO SOLN: ORAL | 0 refills | Status: DC

## 2016-07-24 NOTE — Progress Notes (Signed)
Per pt, no allergies to soy or egg products.Pt not taking any weight loss meds or using  O2 at home.   Pt states she had a colon done over 10 years ago in [place], which was normal.

## 2016-08-07 ENCOUNTER — Ambulatory Visit (AMBULATORY_SURGERY_CENTER): Payer: Medicare Other | Admitting: Gastroenterology

## 2016-08-07 ENCOUNTER — Encounter: Payer: Self-pay | Admitting: Gastroenterology

## 2016-08-07 VITALS — BP 135/69 | HR 57 | Temp 97.5°F | Resp 12 | Ht 62.0 in | Wt 151.0 lb

## 2016-08-07 DIAGNOSIS — D124 Benign neoplasm of descending colon: Secondary | ICD-10-CM

## 2016-08-07 DIAGNOSIS — I10 Essential (primary) hypertension: Secondary | ICD-10-CM | POA: Diagnosis not present

## 2016-08-07 DIAGNOSIS — D123 Benign neoplasm of transverse colon: Secondary | ICD-10-CM | POA: Diagnosis not present

## 2016-08-07 DIAGNOSIS — Z1212 Encounter for screening for malignant neoplasm of rectum: Secondary | ICD-10-CM | POA: Diagnosis not present

## 2016-08-07 DIAGNOSIS — K5730 Diverticulosis of large intestine without perforation or abscess without bleeding: Secondary | ICD-10-CM | POA: Diagnosis not present

## 2016-08-07 DIAGNOSIS — Z1211 Encounter for screening for malignant neoplasm of colon: Secondary | ICD-10-CM | POA: Diagnosis not present

## 2016-08-07 MED ORDER — SODIUM CHLORIDE 0.9 % IV SOLN: 500.0000 mL | INTRAVENOUS | Status: DC

## 2016-08-07 NOTE — Progress Notes (Signed)
Patient awakening,vss,report to rn 

## 2016-08-07 NOTE — Op Note (Signed)
[facility] Patient Name: Joann Wall Procedure Date: 08/07/2016 8:52 AM MRN: [id number] Endoscopist: Milus Banister , MD Age: 70 Referring MD:  Date of Birth: 1946/04/20 Gender: Female Account #: [id number] Procedure:                Colonoscopy Indications:              Screening for colorectal malignant neoplasm;                            colonoscopy [place] >10 years ago was normal per patient Medicines:                Monitored Anesthesia Care Procedure:                Pre-Anesthesia Assessment:                           - Prior to the procedure, a History and Physical                            was performed, and patient medications and                            allergies were reviewed. The patient's tolerance of                            previous anesthesia was also reviewed. The risks                            and benefits of the procedure and the sedation                            options and risks were discussed with the patient.                            All questions were answered, and informed consent                            was obtained. Prior Anticoagulants: The patient has                            taken no previous anticoagulant or antiplatelet                            agents. ASA Grade Assessment: II - A patient with                            mild systemic disease. After reviewing the risks                            and benefits, the patient was deemed in                            satisfactory condition to undergo the procedure.  After obtaining informed consent, the colonoscope                            was passed under direct vision. Throughout the                            procedure, the patient's blood pressure, pulse, and                            oxygen saturations were monitored continuously. The                            Model CF-HQ190L [id number]) scope was introduced                            through the  anus and advanced to the the cecum,                            identified by appendiceal orifice and ileocecal                            valve. The colonoscopy was performed without                            difficulty. The patient tolerated the procedure                            well. The quality of the bowel preparation was                            good. The ileocecal valve, appendiceal orifice, and                            rectum were photographed. Scope In: 9:05:31 AM Scope Out: 9:20:38 AM Scope Withdrawal Time: 0 hours 10 minutes 32 seconds  Total Procedure Duration: 0 hours 15 minutes 7 seconds  Findings:                 Four sessile polyps were found in the descending                            colon and transverse colon. The polyps were 2 to 4                            mm in size. These polyps were removed with a cold                            snare. Resection and retrieval were complete.                           Multiple small and large-mouthed diverticula were                            found in the left colon.  The exam was otherwise without abnormality on                            direct and retroflexion views. Complications:            No immediate complications. Estimated blood loss:                            None. Estimated Blood Loss:     Estimated blood loss: none. Impression:               - Four 2 to 4 mm polyps in the descending colon and                            in the transverse colon, removed with a cold snare.                            Resected and retrieved.                           - Diverticulosis in the left colon.                           - The examination was otherwise normal on direct                            and retroflexion views. Recommendation:           - Patient has a contact number available for                            emergencies. The signs and symptoms of potential                            delayed  complications were discussed with the                            patient. Return to normal activities tomorrow.                            Written discharge instructions were provided to the                            patient.                           - Resume previous diet.                           - Continue present medications.                           You will receive a letter within 2-3 weeks with the                            pathology results and my final recommendations.  If the polyp(s) is proven to be 'pre-cancerous' on                            pathology, you will need repeat colonoscopy in 3-5                            years. If the polyp(s) is NOT 'precancerous' on                            pathology then you should repeat colon cancer                            screening in 10 years with colonoscopy without need                            for colon cancer screening by any method prior to                            then (including stool testing). Milus Banister, MD 08/07/2016 9:23:32 AM This report has been signed electronically.

## 2016-08-07 NOTE — Progress Notes (Signed)
Called to room to assist during endoscopic procedure.  Patient ID and intended procedure confirmed with present staff. Received instructions for my participation in the procedure from the performing physician.  

## 2016-08-07 NOTE — Patient Instructions (Signed)
YOU HAD AN ENDOSCOPIC PROCEDURE TODAY AT [facility]:   Refer to the procedure report that was given to you for any specific questions about what was found during the examination.  If the procedure report does not answer your questions, please call your gastroenterologist to clarify.  If you requested that your care partner not be given the details of your procedure findings, then the procedure report has been included in a sealed envelope for you to review at your convenience later.  YOU SHOULD EXPECT: Some feelings of bloating in the abdomen. Passage of more gas than usual.  Walking can help get rid of the air that was put into your GI tract during the procedure and reduce the bloating. If you had a lower endoscopy (such as a colonoscopy or flexible sigmoidoscopy) you may notice spotting of blood in your stool or on the toilet paper. If you underwent a bowel prep for your procedure, you may not have a normal bowel movement for a few days.  Please Note:  You might notice some irritation and congestion in your nose or some drainage.  This is from the oxygen used during your procedure.  There is no need for concern and it should clear up in a day or so.  SYMPTOMS TO REPORT IMMEDIATELY:   Following lower endoscopy (colonoscopy or flexible sigmoidoscopy):  Excessive amounts of blood in the stool  Significant tenderness or worsening of abdominal pains  Swelling of the abdomen that is new, acute  Fever of 100F or higher   Following upper endoscopy (EGD)  Vomiting of blood or coffee ground material  New chest pain or pain under the shoulder blades  Painful or persistently difficult swallowing  New shortness of breath  Fever of 100F or higher  Black, tarry-looking stools  For urgent or emergent issues, a gastroenterologist can be reached at any hour by calling [phone].   DIET:  We do recommend a small meal at first, but then you may proceed to your regular diet.  Drink  plenty of fluids but you should avoid alcoholic beverages for 24 hours.  ACTIVITY:  You should plan to take it easy for the rest of today and you should NOT DRIVE or use heavy machinery until tomorrow (because of the sedation medicines used during the test).    FOLLOW UP: Our staff will call the number listed on your records the next business day following your procedure to check on you and address any questions or concerns that you may have regarding the information given to you following your procedure. If we do not reach you, we will leave a message.  However, if you are feeling well and you are not experiencing any problems, there is no need to return our call.  We will assume that you have returned to your regular daily activities without incident.  If any biopsies were taken you will be contacted by phone or by letter within the next 1-3 weeks.  Please call us at [phone] if you have not heard about the biopsies in 3 weeks.    SIGNATURES/CONFIDENTIALITY: You and/or your care partner have signed paperwork which will be entered into your electronic medical record.  These signatures attest to the fact that that the information above on your After Visit Summary has been reviewed and is understood.  Full responsibility of the confidentiality of this discharge information lies with you and/or your care-partner.YOU HAD AN ENDOSCOPIC PROCEDURE TODAY AT [facility]:  Refer to the procedure report that was given to you for any specific questions about what was found during the examination.  If the procedure report does not answer your questions, please call your gastroenterologist to clarify.  If you requested that your care partner not be given the details of your procedure findings, then the procedure report has been included in a sealed envelope for you to review at your convenience later.  YOU SHOULD EXPECT: Some feelings of bloating in the abdomen. Passage of more gas than  usual.  Walking can help get rid of the air that was put into your GI tract during the procedure and reduce the bloating. If you had a lower endoscopy (such as a colonoscopy or flexible sigmoidoscopy) you may notice spotting of blood in your stool or on the toilet paper. If you underwent a bowel prep for your procedure, you may not have a normal bowel movement for a few days.  Please Note:  You might notice some irritation and congestion in your nose or some drainage.  This is from the oxygen used during your procedure.  There is no need for concern and it should clear up in a day or so.  SYMPTOMS TO REPORT IMMEDIATELY:   Following lower endoscopy (colonoscopy or flexible sigmoidoscopy):  Excessive amounts of blood in the stool  Significant tenderness or worsening of abdominal pains  Swelling of the abdomen that is new, acute  Fever of 100F or higher  For urgent or emergent issues, a gastroenterologist can be reached at any hour by calling [phone].  Please read all handouts given to you by your recovery nurse today.     DIET:  We do recommend a small meal at first, but then you may proceed to your regular diet.  Drink plenty of fluids but you should avoid alcoholic beverages for 24 hours.  ACTIVITY:  You should plan to take it easy for the rest of today and you should NOT DRIVE or use heavy machinery until tomorrow (because of the sedation medicines used during the test).    FOLLOW UP: Our staff will call the number listed on your records the next business day following your procedure to check on you and address any questions or concerns that you may have regarding the information given to you following your procedure. If we do not reach you, we will leave a message.  However, if you are feeling well and you are not experiencing any problems, there is no need to return our call.  We will assume that you have returned to your regular daily activities without incident.  If any biopsies  were taken you will be contacted by phone or by letter within the next 1-3 weeks.  Please call us at [phone] if you have not heard about the biopsies in 3 weeks.    SIGNATURES/CONFIDENTIALITY: You and/or your care partner have signed paperwork which will be entered into your electronic medical record.  These signatures attest to the fact that that the information above on your After Visit Summary has been reviewed and is understood.  Full responsibility of the confidentiality of this discharge information lies with you and/or your care-partner.  Thank you for letting us take care of your healthcare needs today.

## 2016-08-08 ENCOUNTER — Telehealth: Payer: Self-pay | Admitting: *Deleted

## 2016-08-08 NOTE — Telephone Encounter (Signed)
  Follow up Call-  Call back number 08/07/2016  Post procedure Call Back phone  # [phone]  Permission to leave phone message Yes  Some recent data might be hidden     Patient questions:  Do you have a fever, pain , or abdominal swelling? No. Pain Score  0 *  Have you tolerated food without any problems? Yes.    Have you been able to return to your normal activities? Yes.    Do you have any questions about your discharge instructions: Diet   No. Medications  No. Follow up visit  No.  Do you have questions or concerns about your Care? No.  Actions: * If pain score is 4 or above: No action needed, pain <4.

## 2016-08-13 ENCOUNTER — Encounter: Payer: Self-pay | Admitting: Gastroenterology

## 2016-08-20 DIAGNOSIS — H35373 Puckering of macula, bilateral: Secondary | ICD-10-CM | POA: Diagnosis not present

## 2016-08-20 DIAGNOSIS — H2513 Age-related nuclear cataract, bilateral: Secondary | ICD-10-CM | POA: Diagnosis not present

## 2016-08-20 DIAGNOSIS — H43813 Vitreous degeneration, bilateral: Secondary | ICD-10-CM | POA: Diagnosis not present

## 2016-09-07 ENCOUNTER — Telehealth: Payer: Self-pay

## 2016-09-07 NOTE — Telephone Encounter (Signed)
Pt stated her leg and area around her knee is hurting. Pt states no swelling or discoloration. Pt informed that Dr.Opalski is out of office till Wednesday (09/12/16). Pt states that she is just going to put ice and then do heat on knee to see if it feels better. Pt advised to go to urgent care if symptoms worsen. Bobetta Lime CMA, RT

## 2016-09-14 ENCOUNTER — Ambulatory Visit (INDEPENDENT_AMBULATORY_CARE_PROVIDER_SITE_OTHER): Payer: Medicare Other | Admitting: Family Medicine

## 2016-09-14 ENCOUNTER — Encounter: Payer: Self-pay | Admitting: Family Medicine

## 2016-09-14 VITALS — BP 136/79 | HR 71 | Ht 63.25 in | Wt 156.0 lb

## 2016-09-14 DIAGNOSIS — M1711 Unilateral primary osteoarthritis, right knee: Secondary | ICD-10-CM

## 2016-09-14 DIAGNOSIS — M25561 Pain in right knee: Secondary | ICD-10-CM

## 2016-09-14 DIAGNOSIS — S838X1A Sprain of other specified parts of right knee, initial encounter: Secondary | ICD-10-CM

## 2016-09-14 NOTE — Patient Instructions (Addendum)
Usual topical NSAID'S you have for jt pain- use as prescribed.  Ice 15-20 min each couple hrs  F/up as needed or if you change your find re: the PT- let us know and we can put in referral for you.   If W---> we will do x-ray and consider injections as needed

## 2016-09-14 NOTE — Progress Notes (Signed)
Impression and Recommendations:    The patient was counselled, risk factors were discussed, anticipatory guidance given.   1. Acute pain of right knee   2. Primary osteoarthritis of right knee   3. ?able Meniscal injury, right, initial encounter    KNEE PAIN:  - PT referral will be placed if pt desires--> she will call back and let us know.   Declines right now. Discussed the risks benefits of this with patient and importance of strengthening muscles around the knee area regardless of pathology. - X-ray R knee- patient declines and will RTC is NI/ W near future - tylenol when necessary PO, she has topical NSAIDS at home already; risk/ benefits discussed with patient. - ICE for 15-20 minutes, 3-4 times per day. - pt understands may need MRI if knee does not improve or if worsens - Pt will make follow-up appointment for complete physical exam with fasting blood work in the near future at [facility].  New Prescriptions   No medications on file    Modified Medications   No medications on file    Discontinued Medications   No medications on file     Return if symptoms worsen or fail to improve; F/up for routine care as discussed.  Please see AVS handed out to patient at the end of our visit for further patient instructions/ counseling done pertaining to today's office visit.  Note: This document was prepared using Dragon voice recognition software and may include unintentional dictation errors.    Subjective: Here with DIL who tells me pt has been getting around well, same as her usual.     CC:  7 d ago- awoke and knee was killing her ---> couldn't put wt on it.  Lateral movments make it W.   Been on feet much more than usual with cooking/ holidays etc.  NO known injury, no past h/o pain or injury to R knee.  - feels stable--> not instable, used heat and ice - Much improved--  At least 80% better than last Friday now and able to do all ADL's, but pt  wanted to get it checked out.    Having cataract sx next week.    Patient Care Team    Relationship Specialty Notifications Start End  Mellody Dance, DO PCP - General Family Medicine  04/09/16   Jari Pigg, MD Consulting Physician Dermatology  05/14/16      Past Medical History:  Diagnosis Date  . Contusion, R foot- sees dr Delilah Shan- [facility]    bruised right foot  . Elevated blood pressure (not hypertension) 04/09/2016  . Overweight (BMI 25.0-29.9) 04/09/2016  . S/P bilateral breast reduction- 2000    15 years ago  . Status post left knee replacement- 2004   . Vitamin D insufficiency 04/09/2016    Past Surgical History:  Procedure Laterality Date  . BREAST SURGERY Bilateral    reduction  . COLONOSCOPY    . REPLACEMENT TOTAL KNEE Left     History  Drug Use No  ,  History  Alcohol Use  . 3.0 oz/week  . 5 Glasses of wine per week  ,  History  Smoking Status  . Never Smoker  Smokeless Tobacco  . Never Used  ,  History  Sexual Activity  . Sexual activity: No    Patient's Medications  New Prescriptions   No medications on file  Previous Medications   ASPIRIN EC 81 MG TABLET    Take  81 mg by mouth daily.   BIOTIN PO    Take 1 tablet by mouth daily.   CALCIUM CARBONATE (CALTRATE 600 PO)    Take by mouth daily.   CHOLECALCIFEROL (VITAMIN D3) 1000 UNITS CAPS    Take by mouth daily.   CYANOCOBALAMIN (VITAMIN B 12 PO)    Take 1,000 mcg by mouth daily.   IBUPROFEN (ADVIL,MOTRIN) 400 MG TABLET    Take 1 tablet (400 mg total) by mouth every 6 (six) hours as needed for mild pain or moderate pain.  Modified Medications   No medications on file  Discontinued Medications   No medications on file    Sulfa antibiotics   Review of Systems  Constitutional: Negative for chills and fever.  HENT: Negative for congestion and sinus pain.   Eyes: Positive for blurred vision and double vision.  Respiratory: Negative for shortness of breath and wheezing.   Cardiovascular:  Negative for chest pain, palpitations and orthopnea.  Gastrointestinal: Negative for constipation, diarrhea, heartburn, nausea and vomiting.  Genitourinary: Negative for frequency and hematuria.  Musculoskeletal: Positive for joint pain. Negative for falls.       Chronic jt pains  Skin: Negative for rash.  Neurological: Negative for dizziness, sensory change and focal weakness.  Endo/Heme/Allergies: Negative for polydipsia.  Psychiatric/Behavioral: Negative for depression and suicidal ideas. The patient has insomnia. The patient is not nervous/anxious.      Objective:   Blood pressure 136/79, pulse 71, height 5' 3.25" (1.607 m), weight 156 lb (70.8 kg). Body mass index is 27.42 kg/m.  Bp repeated prior to leaving office- was WNL's upon reck. Initial 159/76 General: Well Developed, well nourished, and in no acute distress.  Neuro: Alert and oriented x3, extra-ocular muscles intact, sensation grossly intact.  HEENT: Normocephalic, atraumatic, pupils equal round reactive to light, neck supple Skin: no gross suspicious lesions or rashes  Cardiac: Regular rate and rhythm, no murmurs rubs or gallops.  Respiratory: Not using accessory muscles, speaking in full sentences.  Musculoskeletal: Ambulates w/o diff, FROM * 4 ext.  Knee:  Scant amount peripatellar swelling o/w essential normal exam; no gross instability to posterior or anterior drawer test; negative varus stress test; equivical discomfort on valgus stress test.  FROM with mild crepitus, no ecchymosis, normal contralateral knee exam. Neurovascularly intact distally. No pain upon quick palpation of the ipsilateral hip and ankle joints. Vasc: less 2 sec cap RF, warm and pink  Psych: judgement and insight good, Euthymic mood. Full Affect.

## 2016-09-20 DIAGNOSIS — H2511 Age-related nuclear cataract, right eye: Secondary | ICD-10-CM | POA: Diagnosis not present

## 2016-10-08 DIAGNOSIS — H2512 Age-related nuclear cataract, left eye: Secondary | ICD-10-CM | POA: Diagnosis not present

## 2016-10-11 DIAGNOSIS — H25012 Cortical age-related cataract, left eye: Secondary | ICD-10-CM | POA: Diagnosis not present

## 2016-10-11 DIAGNOSIS — H2512 Age-related nuclear cataract, left eye: Secondary | ICD-10-CM | POA: Diagnosis not present

## 2016-12-17 IMAGING — CT CT MAXILLOFACIAL W/O CM
3 series · 16 of 47 positions shown, 19 images · non-contrast
Comparison: None.

CLINICAL DATA: Facial injury from fall.  Initial encounter.

EXAM:
CT MAXILLOFACIAL WITHOUT CONTRAST
TECHNIQUE: Multidetector CT imaging of the maxillofacial structures was
performed. Multiplanar CT image reconstructions were also generated.
A small metallic BB was placed on the right temple in order to
reliably differentiate right from left.

[Series 3: facial st · axial · 0.29mm/px · z∈[-152,-18]mm · 10 of 79 slices shown, 13 images]
[im 6/79  brain]
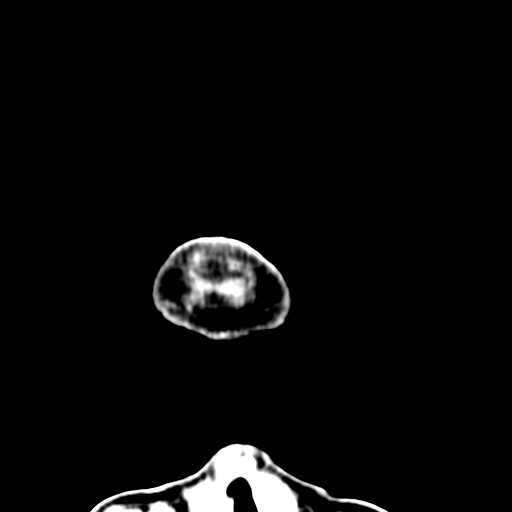
[im 6/79  bone]
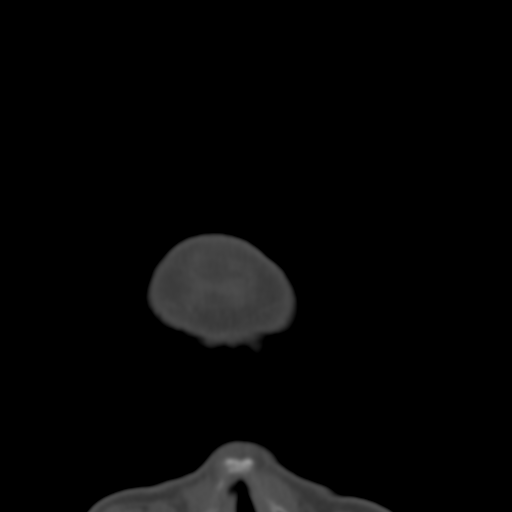
[im 14/79  bone]
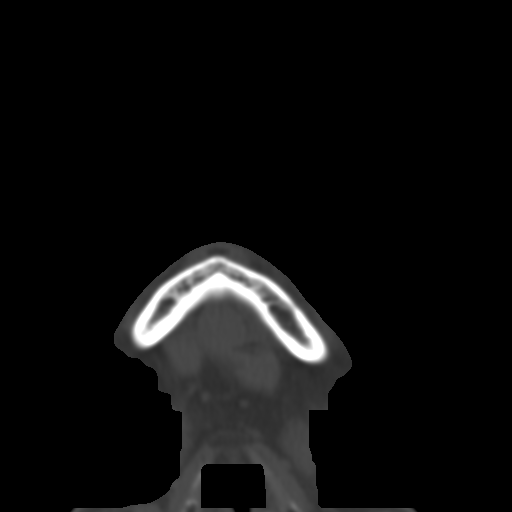
[im 22/79  bone]
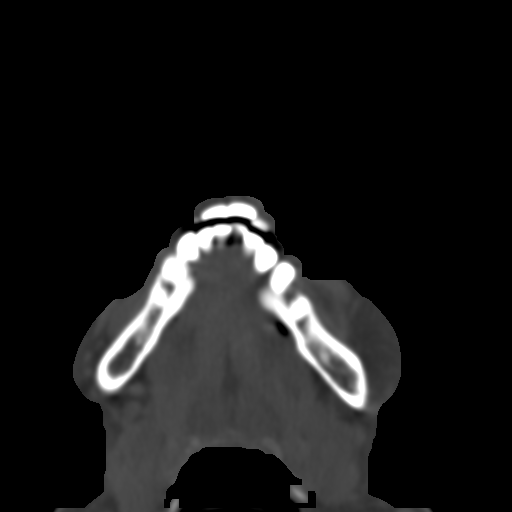
[im 27/79  bone]
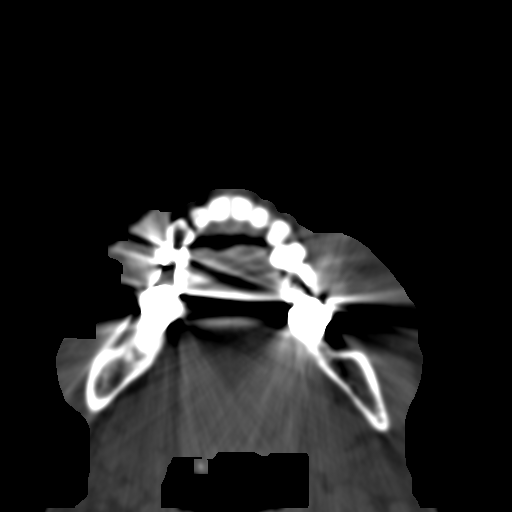
[im 35/79  brain]
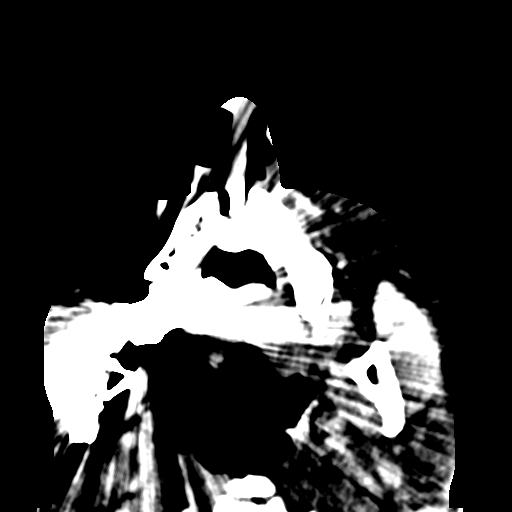
[im 35/79  bone]
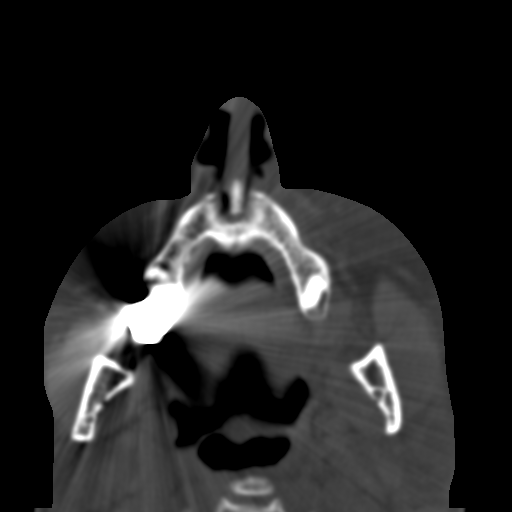
[im 44/79  bone]
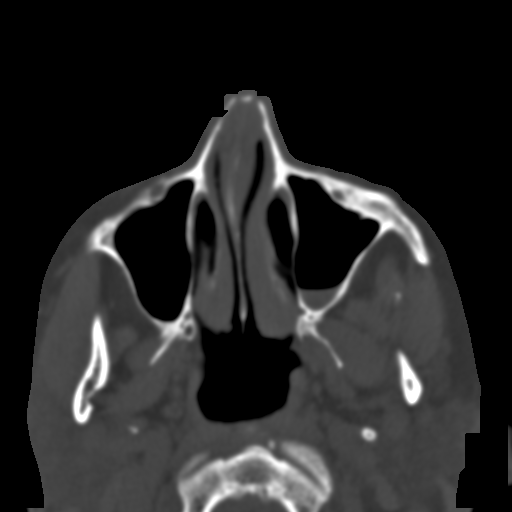
[im 52/79  bone]
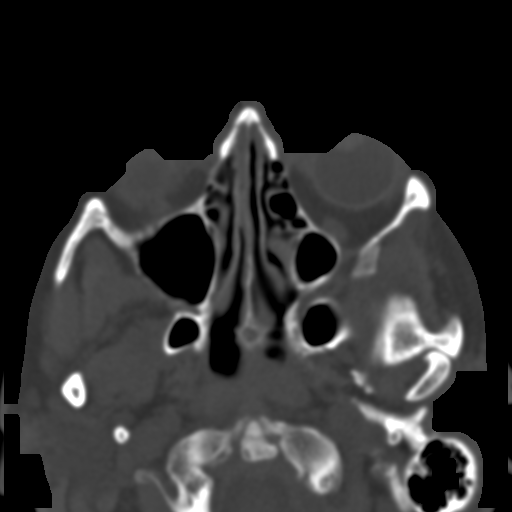
[im 60/79  bone]
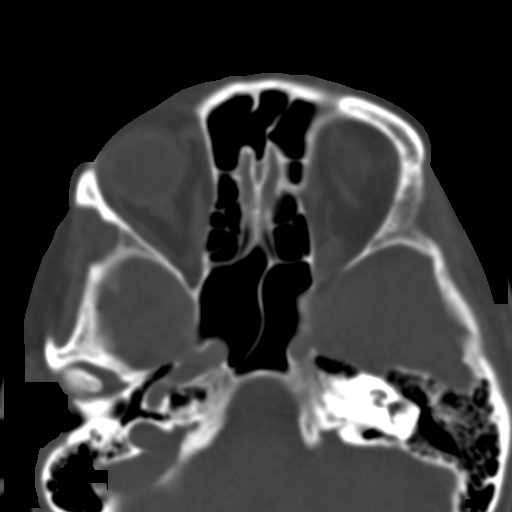
[im 65/79  brain]
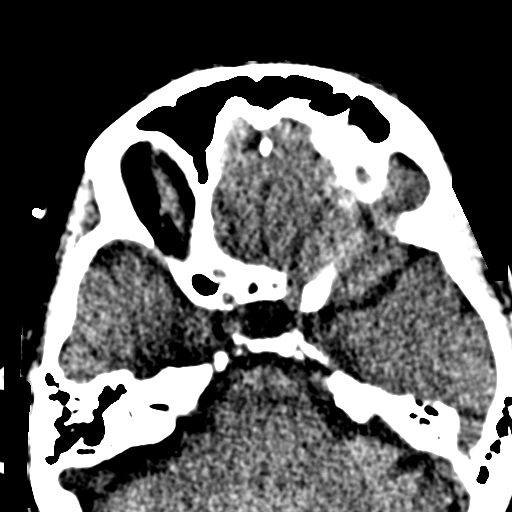
[im 65/79  bone]
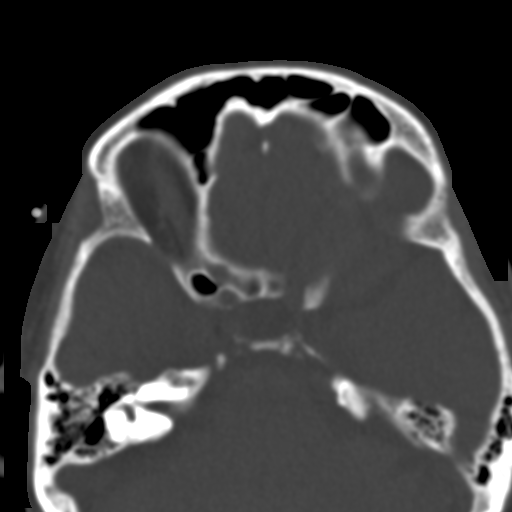
[im 73/79  bone]
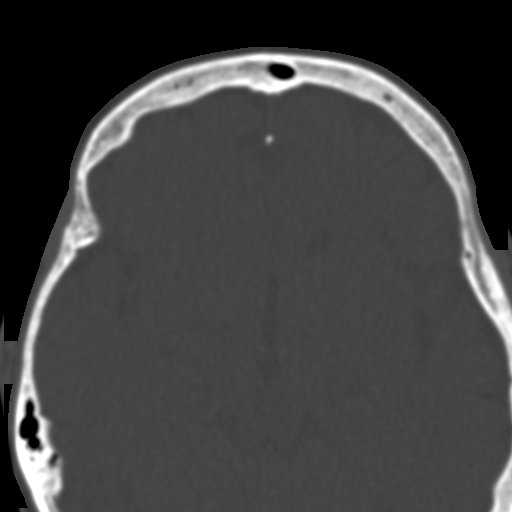

[Series 7: coronal st · coronal · 0.32mm/px · 3 of 67 slices shown]
[im 23/67  bone]
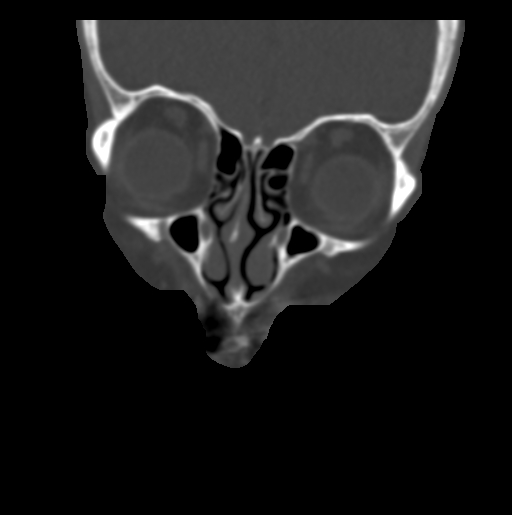
[im 30/67  bone]
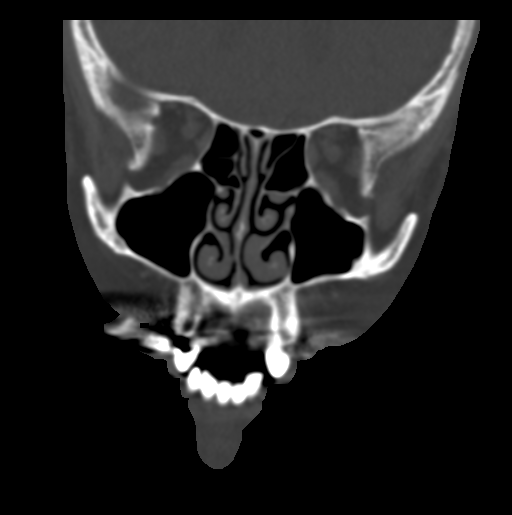
[im 37/67  bone]
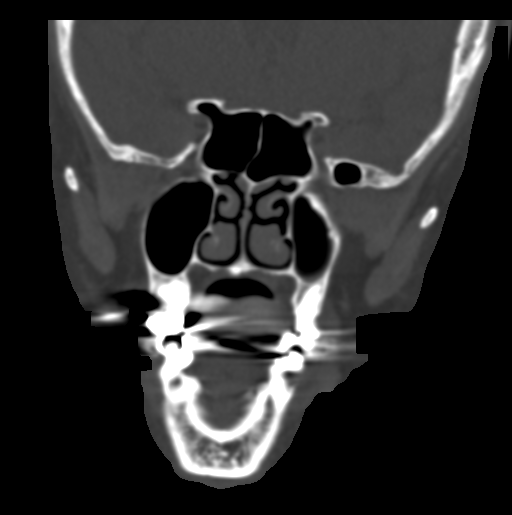

[Series 8: sagittal st · sagittal · 0.29mm/px · 3 of 76 slices shown]
[im 26/76  bone]
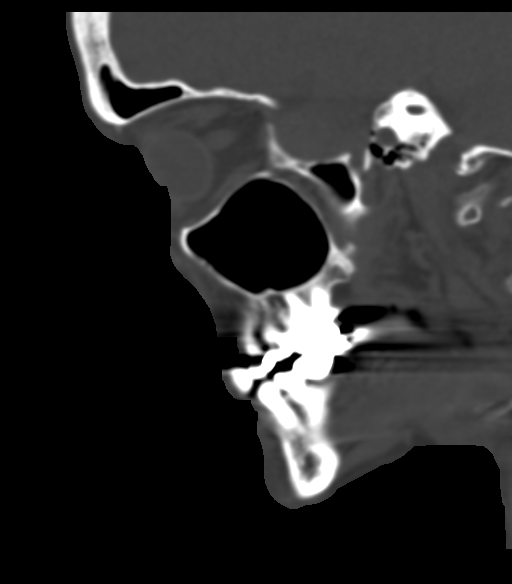
[im 38/76  bone]
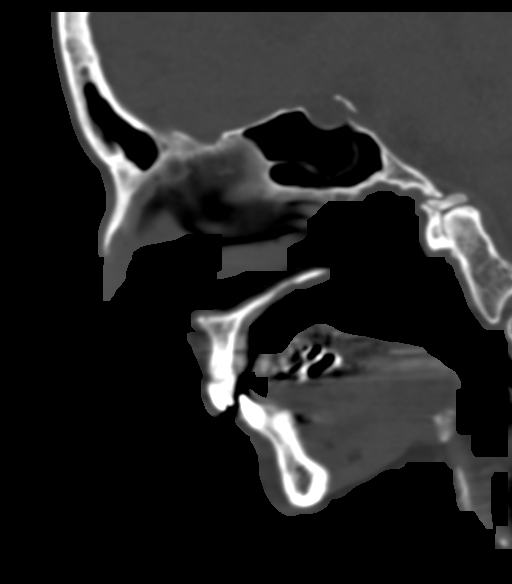
[im 51/76  bone]
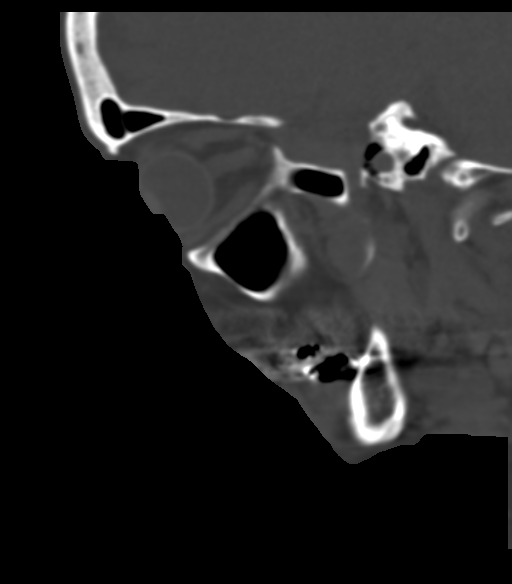

[16 of 47 positions shown; findings below may reference images not displayed]

FINDINGS: Nondisplaced nasal arch fracture most convincing on sagittal
reformats. Minimal mucosal gas on the left deep to the nasal arch.
There is an overlying contusion with soft tissue emphysema tracking
towards the right medial canthus. No opaque foreign body. Left
maxillary fluid level is small but likely low-density. This could be
related to epistaxis or inflammation as there is no associated
fracture. Nasal septum is deviated to the right without fracture
lucency. Located and intact mandible. Incidental left TMJ
osteoarthritis. No evidence of globe injury or postseptal hematoma.
IMPRESSION: 1. Nondisplaced nasal arch fracture with overlying laceration and
contusion.
2. Left maxillary sinus fluid level without associated fracture.

## 2017-01-11 ENCOUNTER — Encounter: Payer: Self-pay | Admitting: Family Medicine

## 2017-01-11 ENCOUNTER — Ambulatory Visit (INDEPENDENT_AMBULATORY_CARE_PROVIDER_SITE_OTHER): Payer: Medicare Other | Admitting: Family Medicine

## 2017-01-11 VITALS — BP 156/72 | HR 66 | Temp 98.2°F | Ht 63.25 in | Wt 155.8 lb

## 2017-01-11 DIAGNOSIS — B9789 Other viral agents as the cause of diseases classified elsewhere: Secondary | ICD-10-CM | POA: Diagnosis not present

## 2017-01-11 DIAGNOSIS — J329 Chronic sinusitis, unspecified: Secondary | ICD-10-CM

## 2017-01-11 DIAGNOSIS — J310 Chronic rhinitis: Secondary | ICD-10-CM

## 2017-01-11 NOTE — Patient Instructions (Signed)
You most likely have a viral infection that should resolve on its own over time (or this could be a flare of seasonal allergies as well).   Symptoms for a viral upper respiratory tract infection usually last 3-7 days but can stretch out to 2-3 weeks before you're feeling back to normal.  Your symptoms should not worsen after 7-10 days and if they do, please notify our office, as you may need antibiotics.  You can use over-the-counter afrin nasal spray for up to 3 days (NO longer than that) which will help acutely with nasal drainage/ congestion short term.   Also, sterile saline nasal rinses, such as Milta Deiters med or AYR sinus rinses, can be very helpful and should be done twice daily- even throughout the allergy season.  Remember you should use distilled water or previously boiled water to do this.   You can also use an over the counter cold and flu medication such as Tylenol Severe Cold and Sinus/Flu or Dayquil, Nyquil and the like, which will help with cough, congestion, headache/ pain, fevers/chills etc.  Please note, if you being treated for hypertension or have high blood pressure, you should be using the ones designated "HBP".    Unfortunately, antibiotics are not helpful for viral infections.  Wash your hands frequently, as you did not want to get those around you sick as well. Never sneeze or cough on others.  And you should not be going to school or work if you are running a temperature of 100.5 or more on two separate occasions.   Drink plenty of fluids and stay hydrated, especially if you are running fevers.  We don't know why, but chicken soup also helps, try it! :)

## 2017-01-11 NOTE — Progress Notes (Signed)
Acute Care Office visit  Assessment and plan:  1. Viral sinusitis   2. Rhinosinusitis    Anticipatory guidance and routine counseling done re: condition, txmnt options and need for follow up. All questions of patient's were answered.  - Viral vs Allergic vs Bacterial causes for pt's symptoms reveiwed.    - Supportive care and various OTC medications discussed in addition to any prescribed. - Call or RTC if new symptoms, or if no improvement or worse over next couple days.   - Will consider ABX at that time if sx continue past 7-10 days and worsening, but she has been improving sev days and no fevers sev days.     New Prescriptions   No medications on file    Modified Medications   No medications on file    Discontinued Medications   No medications on file     Gross side effects, risk and benefits, and alternatives of medications discussed with patient.  Patient is aware that all medications have potential side effects and we are unable to predict every sideeffect or drug-drug interaction that may occur.  Expresses verbal understanding and consents to current therapy plan and treatment regiment.  Return if symptoms worsen or fail to improve.  Please see AVS handed out to patient at the end of our visit for additional patient instructions/ counseling done pertaining to today's office visit.  Note: This document was prepared using Dragon voice recognition software and may include unintentional dictation errors.    Subjective:    Chief Complaint  Patient presents with  . Cough  . Fatigue    HPI:  Pt presents with URI sx for 7 days.      C/o rhinorrhea, head cong, ST, and cough- dry esp at night.     Denies objective F/C now but did have 1.5 days of them at Tm= 101.2 (4&5d  Ago),  No face pain or ear pain but both of her teeth b/l feel sore- started yesterday, No N/V/D, No SOB/DIB/Wh, No Rash.     taken tylenol for sx.    Overall getting BETTER, but D wanted her  to get checked out.      Patient Active Problem List   Diagnosis Date Noted  . Elevated blood pressure (not hypertension) 04/09/2016    Priority: High  . Overweight (BMI 25.0-29.9) 04/09/2016    Priority: High  . Vitamin D insufficiency 04/09/2016    Priority: Medium  . Elevated HDL 06/16/2016  . S/P bilateral breast reduction- 2000 04/09/2016  . Status post left knee replacement- 2004 04/09/2016  . Contusion, R foot- sees dr Delilah Shan- [facility] 04/09/2016    Past medical history, Surgical history, Family history reviewed and noted below, Social history, Allergies, and Medications have been entered into the medical record, reviewed and changed as needed.    Allergies  Allergen Reactions  . Sulfa Antibiotics Other (See Comments)    disoriented    Review of Systems: General:   No current F/C, wt loss Pulm:   No DIB, pleuritic chest pain Card:  No CP, palpitations Abd:  No n/v/d or pain Ext:  No inc edema from baseline   Objective:   Blood pressure (!) 156/72, pulse 66, temperature 98.2 F (36.8 C), temperature source Oral, height 5' 3.25" (1.607 m), weight 155 lb 12.8 oz (70.7 kg). Body mass index is 27.38 kg/m. General: Well Developed, well nourished, appropriate for stated age.  Neuro: Alert and oriented x3, extra-ocular muscles intact, sensation grossly intact.  HEENT: Normocephalic, atraumatic, pupils equal round reactive to light, neck supple, no masses, no painful lymphadenopathy, TM's intact B/L, no acute findings. Nares- patent, clear d/c, red swollen; OP- clear, mild erythema, No TTP sinuses Skin: Warm and dry, no gross rash. Cardiac: RRR, S1 S2,  no murmurs rubs or gallops.  Respiratory: ECTA B/L and A/P, Not using accessory muscles, speaking in full sentences- unlabored. Vascular:  cap RF less 2 sec. Psych: No HI/SI, judgement and insight good, Euthymic mood. Full Affect.    Patient Care Team    Relationship Specialty Notifications Start End  Mellody Dance, DO PCP - General Family Medicine  04/09/16   Jari Pigg, MD Consulting Physician Dermatology  05/14/16

## 2017-01-14 ENCOUNTER — Ambulatory Visit: Payer: Medicare Other | Admitting: Family Medicine

## 2017-03-12 DIAGNOSIS — L559 Sunburn, unspecified: Secondary | ICD-10-CM | POA: Diagnosis not present

## 2017-03-12 DIAGNOSIS — D2272 Melanocytic nevi of left lower limb, including hip: Secondary | ICD-10-CM | POA: Diagnosis not present

## 2017-03-12 DIAGNOSIS — Z85828 Personal history of other malignant neoplasm of skin: Secondary | ICD-10-CM | POA: Diagnosis not present

## 2017-03-12 DIAGNOSIS — L570 Actinic keratosis: Secondary | ICD-10-CM | POA: Diagnosis not present

## 2017-03-12 DIAGNOSIS — D485 Neoplasm of uncertain behavior of skin: Secondary | ICD-10-CM | POA: Diagnosis not present

## 2017-03-12 DIAGNOSIS — D0471 Carcinoma in situ of skin of right lower limb, including hip: Secondary | ICD-10-CM | POA: Diagnosis not present

## 2017-03-26 ENCOUNTER — Other Ambulatory Visit: Payer: Self-pay | Admitting: Family Medicine

## 2017-03-26 DIAGNOSIS — Z1231 Encounter for screening mammogram for malignant neoplasm of breast: Secondary | ICD-10-CM

## 2017-04-10 ENCOUNTER — Ambulatory Visit: Payer: Medicare Other

## 2017-04-12 DIAGNOSIS — G8929 Other chronic pain: Secondary | ICD-10-CM | POA: Diagnosis not present

## 2017-04-12 DIAGNOSIS — M19071 Primary osteoarthritis, right ankle and foot: Secondary | ICD-10-CM | POA: Diagnosis not present

## 2017-04-12 DIAGNOSIS — M21611 Bunion of right foot: Secondary | ICD-10-CM | POA: Diagnosis not present

## 2017-04-12 DIAGNOSIS — M79671 Pain in right foot: Secondary | ICD-10-CM | POA: Diagnosis not present

## 2017-04-17 ENCOUNTER — Ambulatory Visit
Admission: RE | Admit: 2017-04-17 | Discharge: 2017-04-17 | Disposition: A | Discharge status: Home / Self Care | Payer: Medicare Other | Source: Ambulatory Visit | Attending: Family Medicine | Admitting: Family Medicine

## 2017-04-17 DIAGNOSIS — Z1231 Encounter for screening mammogram for malignant neoplasm of breast: Secondary | ICD-10-CM

## 2017-04-24 DIAGNOSIS — D0471 Carcinoma in situ of skin of right lower limb, including hip: Secondary | ICD-10-CM | POA: Diagnosis not present

## 2017-06-19 ENCOUNTER — Telehealth: Payer: Self-pay | Admitting: Family Medicine

## 2017-06-19 DIAGNOSIS — Z23 Encounter for immunization: Secondary | ICD-10-CM | POA: Diagnosis not present

## 2017-06-19 MED ORDER — ZOSTER VAC RECOMB ADJUVANTED 50 MCG/0.5ML IM SUSR: 0.5000 mL | Freq: Once | INTRAMUSCULAR | 0 refills | Status: AC

## 2017-06-19 NOTE — Addendum Note (Signed)
Addended by: Fonnie Mu on: 06/19/2017 04:53 PM   Modules accepted: Orders

## 2017-06-19 NOTE — Telephone Encounter (Signed)
Patient is requesting a prescription for a shingles vaccine to be sent to [facility]. Patient would like to be notified when completed.

## 2017-07-02 ENCOUNTER — Telehealth: Payer: Self-pay | Admitting: Family Medicine

## 2017-07-02 ENCOUNTER — Other Ambulatory Visit: Payer: Self-pay

## 2017-07-02 DIAGNOSIS — Z23 Encounter for immunization: Secondary | ICD-10-CM

## 2017-07-02 MED ORDER — PNEUMOCOCCAL 13-VAL CONJ VACC IM SUSP: 0.5000 mL | INTRAMUSCULAR | 0 refills | Status: AC

## 2017-07-02 NOTE — Telephone Encounter (Signed)
Sent into the pharmacy patient notified. MPulliam, CMA/RT(R)

## 2017-07-02 NOTE — Telephone Encounter (Signed)
Patient wants an order sent to [place] for a pneumonia shot.

## 2017-07-02 NOTE — Telephone Encounter (Signed)
Patient request Pneumonia Vaccine sent to pharmacy.  MPulliam, CMA/RT(R)

## 2017-07-03 DIAGNOSIS — Z23 Encounter for immunization: Secondary | ICD-10-CM | POA: Diagnosis not present

## 2017-11-18 DIAGNOSIS — H43813 Vitreous degeneration, bilateral: Secondary | ICD-10-CM | POA: Diagnosis not present

## 2017-11-18 DIAGNOSIS — Z961 Presence of intraocular lens: Secondary | ICD-10-CM | POA: Diagnosis not present

## 2017-11-18 DIAGNOSIS — H35373 Puckering of macula, bilateral: Secondary | ICD-10-CM | POA: Diagnosis not present

## 2018-03-26 ENCOUNTER — Other Ambulatory Visit: Payer: Self-pay | Admitting: Family Medicine

## 2018-03-26 DIAGNOSIS — Z1231 Encounter for screening mammogram for malignant neoplasm of breast: Secondary | ICD-10-CM

## 2018-04-02 DIAGNOSIS — D0462 Carcinoma in situ of skin of left upper limb, including shoulder: Secondary | ICD-10-CM | POA: Diagnosis not present

## 2018-04-02 DIAGNOSIS — D485 Neoplasm of uncertain behavior of skin: Secondary | ICD-10-CM | POA: Diagnosis not present

## 2018-04-02 DIAGNOSIS — D2272 Melanocytic nevi of left lower limb, including hip: Secondary | ICD-10-CM | POA: Diagnosis not present

## 2018-04-02 DIAGNOSIS — L821 Other seborrheic keratosis: Secondary | ICD-10-CM | POA: Diagnosis not present

## 2018-04-02 DIAGNOSIS — L570 Actinic keratosis: Secondary | ICD-10-CM | POA: Diagnosis not present

## 2018-04-02 DIAGNOSIS — Z85828 Personal history of other malignant neoplasm of skin: Secondary | ICD-10-CM | POA: Diagnosis not present

## 2018-04-02 DIAGNOSIS — B078 Other viral warts: Secondary | ICD-10-CM | POA: Diagnosis not present

## 2018-04-18 ENCOUNTER — Ambulatory Visit
Admission: RE | Admit: 2018-04-18 | Discharge: 2018-04-18 | Disposition: A | Discharge status: Home / Self Care | Payer: Medicare Other | Source: Ambulatory Visit | Attending: Family Medicine | Admitting: Family Medicine

## 2018-04-18 DIAGNOSIS — Z1231 Encounter for screening mammogram for malignant neoplasm of breast: Secondary | ICD-10-CM

## 2018-05-28 DIAGNOSIS — D0462 Carcinoma in situ of skin of left upper limb, including shoulder: Secondary | ICD-10-CM | POA: Diagnosis not present

## 2018-11-19 DIAGNOSIS — H04123 Dry eye syndrome of bilateral lacrimal glands: Secondary | ICD-10-CM | POA: Diagnosis not present

## 2018-11-19 DIAGNOSIS — Z9889 Other specified postprocedural states: Secondary | ICD-10-CM | POA: Diagnosis not present

## 2018-11-19 DIAGNOSIS — Z961 Presence of intraocular lens: Secondary | ICD-10-CM | POA: Diagnosis not present

## 2018-11-19 DIAGNOSIS — H35373 Puckering of macula, bilateral: Secondary | ICD-10-CM | POA: Diagnosis not present

## 2018-11-19 DIAGNOSIS — H43813 Vitreous degeneration, bilateral: Secondary | ICD-10-CM | POA: Diagnosis not present

## 2019-02-18 ENCOUNTER — Encounter: Payer: Medicare Other | Admitting: Family Medicine

## 2019-04-10 DIAGNOSIS — D2272 Melanocytic nevi of left lower limb, including hip: Secondary | ICD-10-CM | POA: Diagnosis not present

## 2019-04-10 DIAGNOSIS — Z85828 Personal history of other malignant neoplasm of skin: Secondary | ICD-10-CM | POA: Diagnosis not present

## 2019-04-10 DIAGNOSIS — L570 Actinic keratosis: Secondary | ICD-10-CM | POA: Diagnosis not present

## 2019-04-10 DIAGNOSIS — L821 Other seborrheic keratosis: Secondary | ICD-10-CM | POA: Diagnosis not present

## 2019-04-20 ENCOUNTER — Other Ambulatory Visit: Payer: Self-pay

## 2019-04-20 ENCOUNTER — Encounter: Payer: Self-pay | Admitting: Family Medicine

## 2019-04-20 ENCOUNTER — Ambulatory Visit (INDEPENDENT_AMBULATORY_CARE_PROVIDER_SITE_OTHER): Payer: Medicare Other | Admitting: Family Medicine

## 2019-04-20 VITALS — BP 143/78 | HR 59 | Temp 98.1°F | Ht 62.0 in | Wt 152.3 lb

## 2019-04-20 DIAGNOSIS — E559 Vitamin D deficiency, unspecified: Secondary | ICD-10-CM | POA: Diagnosis not present

## 2019-04-20 DIAGNOSIS — E7889 Other lipoprotein metabolism disorders: Secondary | ICD-10-CM

## 2019-04-20 DIAGNOSIS — R5383 Other fatigue: Secondary | ICD-10-CM

## 2019-04-20 DIAGNOSIS — E2839 Other primary ovarian failure: Secondary | ICD-10-CM | POA: Diagnosis not present

## 2019-04-20 DIAGNOSIS — Z0000 Encounter for general adult medical examination without abnormal findings: Secondary | ICD-10-CM | POA: Diagnosis not present

## 2019-04-20 DIAGNOSIS — R7309 Other abnormal glucose: Secondary | ICD-10-CM

## 2019-04-20 NOTE — Progress Notes (Signed)
Subjective:   Joann Wall is a 73 y.o. female who presents for Medicare Annual (Subsequent) preventive examination.  Review of Systems:  A fourteen system review of systems was performed and found to be positive as per HPI.     Objective:    Vitals: BP (!) 143/78   Pulse (!) 59   Temp 98.1 F (36.7 C)   Ht 5\' 2"  (1.575 m)   Wt 152 lb 4.8 oz (69.1 kg)   SpO2 99%   BMI 27.86 kg/m   Body mass index is 27.86 kg/m.  Advanced Directives 04/09/2016 04/05/2016  Does Patient Have a Medical Advance Directive? No No  Would patient like information on creating a medical advance directive? No - patient declined information No - patient declined information    Tobacco Social History   Tobacco Use  Smoking Status Never Smoker  Smokeless Tobacco Never Used     Counseling given: Not Answered   Past Medical History:  Diagnosis Date  . Contusion, R foot- sees dr Delilah Shan- [facility]    bruised right foot  . Elevated blood pressure (not hypertension) 04/09/2016  . Overweight (BMI 25.0-29.9) 04/09/2016  . S/P bilateral breast reduction- 2000    15 years ago  . Status post left knee replacement- 2004   . Vitamin D insufficiency 04/09/2016   Past Surgical History:  Procedure Laterality Date  . BREAST BIOPSY Right   . BREAST SURGERY Bilateral    reduction  . COLONOSCOPY    . REDUCTION MAMMAPLASTY Bilateral   . REPLACEMENT TOTAL KNEE Left    Family History  Problem Relation Age of Onset  . Stroke Mother   . COPD Father   . Obesity Sister   . Hypertension Sister   . Crohn's disease Brother   . Healthy Daughter   . Healthy Son   . Suicidality Brother    Social History   Socioeconomic History  . Marital status: Widowed    Spouse name: Not on file  . Number of children: Not on file  . Years of education: Not on file  . Highest education level: Not on file  Occupational History  . Not on file  Social Needs  . Financial resource strain: Not on file  . Food  insecurity    Worry: Not on file    Inability: Not on file  . Transportation needs    Medical: Not on file    Non-medical: Not on file  Tobacco Use  . Smoking status: Never Smoker  . Smokeless tobacco: Never Used  Substance and Sexual Activity  . Alcohol use: Yes    Alcohol/week: 5.0 standard drinks    Types: 5 Glasses of wine per week  . Drug use: No  . Sexual activity: Never  Lifestyle  . Physical activity    Days per week: Not on file    Minutes per session: Not on file  . Stress: Not on file  Relationships  . Social Herbalist on phone: Not on file    Gets together: Not on file    Attends religious service: Not on file    Active member of club or organization: Not on file    Attends meetings of clubs or organizations: Not on file    Relationship status: Not on file  Other Topics Concern  . Not on file  Social History Narrative  . Not on file    Outpatient Encounter Medications as of 04/20/2019  Medication Sig  .  BIOTIN PO Take 1 tablet by mouth daily.  . Calcium Carbonate (CALTRATE 600 PO) Take by mouth daily.  . Cholecalciferol (VITAMIN D3) 1000 units CAPS Take by mouth daily.  . Cyanocobalamin (VITAMIN B 12 PO) Take 1,000 mcg by mouth daily.  Marland Kitchen ibuprofen (ADVIL,MOTRIN) 400 MG tablet Take 1 tablet (400 mg total) by mouth every 6 (six) hours as needed for mild pain or moderate pain.  . [DISCONTINUED] aspirin EC 81 MG tablet Take 81 mg by mouth daily.   Facility-Administered Encounter Medications as of 04/20/2019  Medication  . 0.9 %  sodium chloride infusion    Activities of Daily Living In your present state of health, do you have any difficulty performing the following activities: 04/20/2019  Hearing? N  Vision? N  Difficulty concentrating or making decisions? N  Walking or climbing stairs? N  Dressing or bathing? N  Doing errands, shopping? N  Some recent data might be hidden   Activities of Daily Living In your present state of health, do you  have difficulty performing the following activities?  1- Driving - no 2- Managing money - no 3- Feeding yourself - no 4- Getting from the bed to the chair - no 5- Climbing a flight of stairs - no 6- Preparing food and eating - no 7- Bathing or showering - no 8- Getting dressed - no 9- Getting to the toilet - no 10- Using the toilet - no 11- Moving around from place to place - no  Patient states that she feels safe at home.    Patient Care Team: Mellody Dance, DO as PCP - General (Family Medicine) Jari Pigg, MD as Consulting Physician (Dermatology) Warden Fillers, MD as Consulting Physician (Ophthalmology)    Assessment:   This is a routine wellness examination for Schylar.  Exercise Activities and Dietary recommendations Current Exercise Habits: Home exercise routine, Type of exercise: walking  Goals   None     Fall Risk Fall Risk  04/20/2019 04/09/2016  Falls in the past year? 0 Yes  Number falls in past yr: - 1  Injury with Fall? - Yes  Follow up Falls evaluation completed -   Is the patient's home free of loose throw rugs in walkways, pet beds, electrical cords, etc?   yes      Grab bars in the bathroom? no      Handrails on the stairs?   no stairs      Adequate lighting?   yes  Timed Get Up and Go performed: Passed  Depression Screen PHQ 2/9 Scores 04/20/2019 04/09/2016  PHQ - 2 Score 0 0  PHQ- 9 Score 0 -   Depression screen [facility] 2/9 04/20/2019 04/09/2016  Decreased Interest 0 0  Down, Depressed, Hopeless 0 0  PHQ - 2 Score 0 0  Altered sleeping 0 -  Tired, decreased energy 0 -  Change in appetite 0 -  Feeling bad or failure about yourself  0 -  Trouble concentrating 0 -  Moving slowly or fidgety/restless 0 -  Suicidal thoughts 0 -  PHQ-9 Score 0 -  Difficult doing work/chores Not difficult at all -    Cognitive Function     6CIT Screen 04/20/2019  What Year? 0 points  What month? 0 points  What time? 0 points  Count back from 20 0 points   Months in reverse 0 points  Repeat phrase 0 points  Total Score 0    Immunization History  Administered Date(s) Administered  . Influenza, High  Dose Seasonal PF 09/19/2018  . Influenza,inj,Quad PF,6+ Mos 06/12/2016  . Influenza-Unspecified 06/19/2017  . Pneumococcal Polysaccharide-23 03/01/2010  . Tdap 03/19/2011, 06/12/2016  . Zoster Recombinat (Shingrix) 06/21/2017, 11/08/2017    Qualifies for Shingles Vaccine? Patient had 06/21/2017 and 11/08/2010  Screening Tests Health Maintenance  Topic Date Due  . INFLUENZA VACCINE  04/18/2019  . DEXA SCAN  04/19/2020 (Originally 02/18/2010)  . Hepatitis C Screening  04/19/2020 (Originally 1946/05/15)  . PNA vac Low Risk Adult (2 of 2 - PCV13) 04/19/2020 (Originally 03/02/2011)  . MAMMOGRAM  04/18/2020  . COLONOSCOPY  08/07/2021  . TETANUS/TDAP  06/12/2026    Cancer Screenings: Lung: Low Dose CT Chest recommended if Age 79-80 years, 30 pack-year currently smoking OR have quit w/in 15years. Patient does not qualify. Breast:  Up to date on Mammogram? No  Patient to call for appointment Up to date of Bone Density/Dexa? No  Ordered today Colorectal: 08/07/2016  Repeat 5 years  Additional Screenings: : Hepatitis C Screening: n/a     Plan:   -Order full set of fasting blood work -Discussed with patient we should consider Cologuard for a 5-year repeat after polyps were found-benign on 08/07/2016 on colonoscopy. -We will order bone density since last time was about 7 years ago. -She will call for mammogram as well.  She can get both these done same place told her. -She has chronic tinnitus but we will due a hearing screen today.  I have personally reviewed and noted the following in the patient's chart:   . Medical and social history . Use of alcohol, tobacco or illicit drugs  . Current medications and supplements . Functional ability and status . Nutritional status . Physical activity . Advanced directives . List of other  physicians . Hospitalizations, surgeries, and ER visits in previous 12 months . Vitals . Screenings to include cognitive, depression, and falls . Referrals and appointments  In addition, I have reviewed and discussed with patient certain preventive protocols, quality metrics, and best practice recommendations. A written personalized care plan for preventive services as well as general preventive health recommendations were provided to patient.     Mellody Dance, DO  04/20/2019

## 2019-04-20 NOTE — Patient Instructions (Addendum)
Please remember in 11 of 2022, you will be due for a Cologuard test.  Since you are after 75 years old-this would be best to do at that time.  Please note if it is positive in some way, it is recommended you follow-up with colonoscopy.     Preventive Care for Adults, Female  A healthy lifestyle and preventive care can promote health and wellness. Preventive health guidelines for women include the following key practices.   A routine yearly physical is a good way to check with your health care provider about your health and preventive screening. It is a chance to share any concerns and updates on your health and to receive a thorough exam.   Visit your dentist for a routine exam and preventive care every 6 months. Brush your teeth twice a day and floss once a day. Good oral hygiene prevents tooth decay and gum disease.   The frequency of eye exams is based on your age, health, family medical history, use of contact lenses, and other factors. Follow your health care provider's recommendations for frequency of eye exams.   Eat a healthy diet. Foods like vegetables, fruits, whole grains, low-fat dairy products, and lean protein foods contain the nutrients you need without too many calories. Decrease your intake of foods high in solid fats, added sugars, and salt. Eat the right amount of calories for you.Get information about a proper diet from your health care provider, if necessary.   Regular physical exercise is one of the most important things you can do for your health. Most adults should get at least 150 minutes of moderate-intensity exercise (any activity that increases your heart rate and causes you to sweat) each week. In addition, most adults need muscle-strengthening exercises on 2 or more days a week.   Maintain a healthy weight. The body mass index (BMI) is a screening tool to identify possible weight problems. It provides an estimate of body fat based on height and weight. Your  health care provider can find your BMI, and can help you achieve or maintain a healthy weight.For adults 20 years and older:   - A BMI below 18.5 is considered underweight.   - A BMI of 18.5 to 24.9 is normal.   - A BMI of 25 to 29.9 is considered overweight.   - A BMI of 30 and above is considered obese.   Maintain normal blood lipids and cholesterol levels by exercising and minimizing your intake of trans and saturated fats.  Eat a balanced diet with plenty of fruit and vegetables. Blood tests for lipids and cholesterol should begin at age 52 and be repeated every 5 years minimum.  If your lipid or cholesterol levels are high, you are over 40, or you are at high risk for heart disease, you may need your cholesterol levels checked more frequently.Ongoing high lipid and cholesterol levels should be treated with medicines if diet and exercise are not working.   If you smoke, find out from your health care provider how to quit. If you do not use tobacco, do not start.   Lung cancer screening is recommended for adults aged 66-80 years who are at high risk for developing lung cancer because of a history of smoking. A yearly low-dose CT scan of the lungs is recommended for people who have at least a 30-pack-year history of smoking and are a current smoker or have quit within the past 15 years. A pack year of smoking is smoking an average of  1 pack of cigarettes a day for 1 year (for example: 1 pack a day for 30 years or 2 packs a day for 15 years). Yearly screening should continue until the smoker has stopped smoking for at least 15 years. Yearly screening should be stopped for people who develop a health problem that would prevent them from having lung cancer treatment.   If you are pregnant, do not drink alcohol. If you are breastfeeding, be very cautious about drinking alcohol. If you are not pregnant and choose to drink alcohol, do not have more than 1 drink per day. One drink is considered to be 12  ounces (355 mL) of beer, 5 ounces (148 mL) of wine, or 1.5 ounces (44 mL) of liquor.   Avoid use of street drugs. Do not share needles with anyone. Ask for help if you need support or instructions about stopping the use of drugs.   High blood pressure causes heart disease and increases the risk of stroke. Your blood pressure should be checked at least yearly.  Ongoing high blood pressure should be treated with medicines if weight loss and exercise do not work.   If you are 52-83 years old, ask your health care provider if you should take aspirin to prevent strokes.   Diabetes screening involves taking a blood sample to check your fasting blood sugar level. This should be done once every 3 years, after age 53, if you are within normal weight and without risk factors for diabetes. Testing should be considered at a younger age or be carried out more frequently if you are overweight and have at least 1 risk factor for diabetes.   Breast cancer screening is essential preventive care for women. You should practice "breast self-awareness."  This means understanding the normal appearance and feel of your breasts and may include breast self-examination.  Any changes detected, no matter how small, should be reported to a health care provider.  Women in their 67s and 30s should have a clinical breast exam (CBE) by a health care provider as part of a regular health exam every 1 to 3 years.  After age 63, women should have a CBE every year.  Starting at age 52, women should consider having a mammogram (breast X-ray test) every year.  Women who have a family history of breast cancer should talk to their health care provider about genetic screening.  Women at a high risk of breast cancer should talk to their health care providers about having an MRI and a mammogram every year.   -Breast cancer gene (BRCA)-related cancer risk assessment is recommended for women who have family members with BRCA-related cancers.  BRCA-related cancers include breast, ovarian, tubal, and peritoneal cancers. Having family members with these cancers may be associated with an increased risk for harmful changes (mutations) in the breast cancer genes BRCA1 and BRCA2. Results of the assessment will determine the need for genetic counseling and BRCA1 and BRCA2 testing.   The Pap test is a screening test for cervical cancer. A Pap test can show cell changes on the cervix that might become cervical cancer if left untreated. A Pap test is a procedure in which cells are obtained and examined from the lower end of the uterus (cervix).   - Women should have a Pap test starting at age 52.   - Between ages 29 and 78, Pap tests should be repeated every 2 years.   - Beginning at age 68, you should have a Pap test every  3 years as long as the past 3 Pap tests have been normal.   - Some women have medical problems that increase the chance of getting cervical cancer. Talk to your health care provider about these problems. It is especially important to talk to your health care provider if a new problem develops soon after your last Pap test. In these cases, your health care provider may recommend more frequent screening and Pap tests.   - The above recommendations are the same for women who have or have not gotten the vaccine for human papillomavirus (HPV).   - If you had a hysterectomy for a problem that was not cancer or a condition that could lead to cancer, then you no longer need Pap tests. Even if you no longer need a Pap test, a regular exam is a good idea to make sure no other problems are starting.   - If you are between ages 52 and 97 years, and you have had normal Pap tests going back 10 years, you no longer need Pap tests. Even if you no longer need a Pap test, a regular exam is a good idea to make sure no other problems are starting.   - If you have had past treatment for cervical cancer or a condition that could lead to cancer, you  need Pap tests and screening for cancer for at least 20 years after your treatment.   - If Pap tests have been discontinued, risk factors (such as a new sexual partner) need to be reassessed to determine if screening should be resumed.   - The HPV test is an additional test that may be used for cervical cancer screening. The HPV test looks for the virus that can cause the cell changes on the cervix. The cells collected during the Pap test can be tested for HPV. The HPV test could be used to screen women aged 39 years and older, and should be used in women of any age who have unclear Pap test results. After the age of 64, women should have HPV testing at the same frequency as a Pap test.   Colorectal cancer can be detected and often prevented. Most routine colorectal cancer screening begins at the age of 20 years and continues through age 24 years. However, your health care provider may recommend screening at an earlier age if you have risk factors for colon cancer. On a yearly basis, your health care provider may provide home test kits to check for hidden blood in the stool.  Use of a small camera at the end of a tube, to directly examine the colon (sigmoidoscopy or colonoscopy), can detect the earliest forms of colorectal cancer. Talk to your health care provider about this at age 94, when routine screening begins. Direct exam of the colon should be repeated every 5 -10 years through age 32 years, unless early forms of pre-cancerous polyps or small growths are found.   People who are at an increased risk for hepatitis B should be screened for this virus. You are considered at high risk for hepatitis B if:  -You were born in a country where hepatitis B occurs often. Talk with your health care provider about which countries are considered high risk.  - Your parents were born in a high-risk country and you have not received a shot to protect against hepatitis B (hepatitis B vaccine).  - You have HIV or  AIDS.  - You use needles to inject street drugs.  - You  live with, or have sex with, someone who has Hepatitis B.  - You get hemodialysis treatment.  - You take certain medicines for conditions like cancer, organ transplantation, and autoimmune conditions.   Hepatitis C blood testing is recommended for all people born from [birth year] through 1965 and any individual with known risks for hepatitis C.   Practice safe sex. Use condoms and avoid high-risk sexual practices to reduce the spread of sexually transmitted infections (STIs). STIs include gonorrhea, chlamydia, syphilis, trichomonas, herpes, HPV, and human immunodeficiency virus (HIV). Herpes, HIV, and HPV are viral illnesses that have no cure. They can result in disability, cancer, and death. Sexually active women aged 1 years and younger should be checked for chlamydia. Older women with new or multiple partners should also be tested for chlamydia. Testing for other STIs is recommended if you are sexually active and at increased risk.   Osteoporosis is a disease in which the bones lose minerals and strength with aging. This can result in serious bone fractures or breaks. The risk of osteoporosis can be identified using a bone density scan. Women ages 55 years and over and women at risk for fractures or osteoporosis should discuss screening with their health care providers. Ask your health care provider whether you should take a calcium supplement or vitamin D to There are also several preventive steps women can take to avoid osteoporosis and resulting fractures or to keep osteoporosis from worsening. -->Recommendations include:  Eat a balanced diet high in fruits, vegetables, calcium, and vitamins.  Get enough calcium. The recommended total intake of is 1,200 mg daily; for best absorption, if taking supplements, divide doses into 250-500 mg doses throughout the day. Of the two types of calcium, calcium carbonate is best absorbed when taken with food  but calcium citrate can be taken on an empty stomach.  Get enough vitamin D. [employer] and the [place] recommend at least 1,000 IU per day for women age 59 and over who are at risk of vitamin D deficiency. Vitamin D deficiency can be caused by inadequate sun exposure (for example, those who live in [place]).  Avoid alcohol and smoking. Heavy alcohol intake (more than 7 drinks per week) increases the risk of falls and hip fracture and women smokers tend to lose bone more rapidly and have lower bone mass than nonsmokers. Stopping smoking is one of the most important changes women can make to improve their health and decrease risk for disease.  Be physically active every day. Weight-bearing exercise (for example, fast walking, hiking, jogging, and weight training) may strengthen bones or slow the rate of bone loss that comes with aging. Balancing and muscle-strengthening exercises can reduce the risk of falling and fracture.  Consider therapeutic medications. Currently, several types of effective drugs are available. Healthcare providers can recommend the type most appropriate for each woman.  Eliminate environmental factors that may contribute to accidents. Falls cause nearly 90% of all osteoporotic fractures, so reducing this risk is an important bone-health strategy. Measures include ample lighting, removing obstructions to walking, using nonskid rugs on floors, and placing mats and/or grab bars in showers.  Be aware of medication side effects. Some common medicines make bones weaker. These include a type of steroid drug called glucocorticoids used for arthritis and asthma, some antiseizure drugs, certain sleeping pills, treatments for endometriosis, and some cancer drugs. An overactive thyroid gland or using too much thyroid hormone for an underactive thyroid can also be a problem. If you are taking  these medicines, talk to your doctor about what you can do to help protect  your bones.reduce the rate of osteoporosis.    Menopause can be associated with physical symptoms and risks. Hormone replacement therapy is available to decrease symptoms and risks. You should talk to your health care provider about whether hormone replacement therapy is right for you.   Use sunscreen. Apply sunscreen liberally and repeatedly throughout the day. You should seek shade when your shadow is shorter than you. Protect yourself by wearing long sleeves, pants, a wide-brimmed hat, and sunglasses year round, whenever you are outdoors.   Once a month, do a whole body skin exam, using a mirror to look at the skin on your back. Tell your health care provider of new moles, moles that have irregular borders, moles that are larger than a pencil eraser, or moles that have changed in shape or color.   -Stay current with required vaccines (immunizations).   Influenza vaccine. All adults should be immunized every year.  Tetanus, diphtheria, and acellular pertussis (Td, Tdap) vaccine. Pregnant women should receive 1 dose of Tdap vaccine during each pregnancy. The dose should be obtained regardless of the length of time since the last dose. Immunization is preferred during the 27th 36th week of gestation. An adult who has not previously received Tdap or who does not know her vaccine status should receive 1 dose of Tdap. This initial dose should be followed by tetanus and diphtheria toxoids (Td) booster doses every 10 years. Adults with an unknown or incomplete history of completing a 3-dose immunization series with Td-containing vaccines should begin or complete a primary immunization series including a Tdap dose. Adults should receive a Td booster every 10 years.  Varicella vaccine. An adult without evidence of immunity to varicella should receive 2 doses or a second dose if she has previously received 1 dose. Pregnant females who do not have evidence of immunity should receive the first dose after  pregnancy. This first dose should be obtained before leaving the health care facility. The second dose should be obtained 4 8 weeks after the first dose.  Human papillomavirus (HPV) vaccine. Females aged 44 26 years who have not received the vaccine previously should obtain the 3-dose series. The vaccine is not recommended for use in pregnant females. However, pregnancy testing is not needed before receiving a dose. If a female is found to be pregnant after receiving a dose, no treatment is needed. In that case, the remaining doses should be delayed until after the pregnancy. Immunization is recommended for any person with an immunocompromised condition through the age of 63 years if she did not get any or all doses earlier. During the 3-dose series, the second dose should be obtained 4 8 weeks after the first dose. The third dose should be obtained 24 weeks after the first dose and 16 weeks after the second dose.  Zoster vaccine. One dose is recommended for adults aged 78 years or older unless certain conditions are present.  Measles, mumps, and rubella (MMR) vaccine. Adults born before [birth year] generally are considered immune to measles and mumps. Adults born in [birth year] or later should have 1 or more doses of MMR vaccine unless there is a contraindication to the vaccine or there is laboratory evidence of immunity to each of the three diseases. A routine second dose of MMR vaccine should be obtained at least 28 days after the first dose for students attending postsecondary schools, health care workers, or international travelers. People who  received inactivated measles vaccine or an unknown type of measles vaccine during 1963 1967 should receive 2 doses of MMR vaccine. People who received inactivated mumps vaccine or an unknown type of mumps vaccine before 1979 and are at high risk for mumps infection should consider immunization with 2 doses of MMR vaccine. For females of childbearing age, rubella immunity should  be determined. If there is no evidence of immunity, females who are not pregnant should be vaccinated. If there is no evidence of immunity, females who are pregnant should delay immunization until after pregnancy. Unvaccinated health care workers born before [birth year] who lack laboratory evidence of measles, mumps, or rubella immunity or laboratory confirmation of disease should consider measles and mumps immunization with 2 doses of MMR vaccine or rubella immunization with 1 dose of MMR vaccine.  Pneumococcal 13-valent conjugate (PCV13) vaccine. When indicated, a person who is uncertain of her immunization history and has no record of immunization should receive the PCV13 vaccine. An adult aged 25 years or older who has certain medical conditions and has not been previously immunized should receive 1 dose of PCV13 vaccine. This PCV13 should be followed with a dose of pneumococcal polysaccharide (PPSV23) vaccine. The PPSV23 vaccine dose should be obtained at least 8 weeks after the dose of PCV13 vaccine. An adult aged 17 years or older who has certain medical conditions and previously received 1 or more doses of PPSV23 vaccine should receive 1 dose of PCV13. The PCV13 vaccine dose should be obtained 1 or more years after the last PPSV23 vaccine dose.  Pneumococcal polysaccharide (PPSV23) vaccine. When PCV13 is also indicated, PCV13 should be obtained first. All adults aged 100 years and older should be immunized. An adult younger than age 76 years who has certain medical conditions should be immunized. Any person who resides in a nursing home or long-term care facility should be immunized. An adult smoker should be immunized. People with an immunocompromised condition and certain other conditions should receive both PCV13 and PPSV23 vaccines. People with human immunodeficiency virus (HIV) infection should be immunized as soon as possible after diagnosis. Immunization during chemotherapy or radiation therapy should be  avoided. Routine use of PPSV23 vaccine is not recommended for American Indians, [place] Natives, or people younger than 65 years unless there are medical conditions that require PPSV23 vaccine. When indicated, people who have unknown immunization and have no record of immunization should receive PPSV23 vaccine. One-time revaccination 5 years after the first dose of PPSV23 is recommended for people aged 71 64 years who have chronic kidney failure, nephrotic syndrome, asplenia, or immunocompromised conditions. People who received 1 2 doses of PPSV23 before age 39 years should receive another dose of PPSV23 vaccine at age 91 years or later if at least 5 years have passed since the previous dose. Doses of PPSV23 are not needed for people immunized with PPSV23 at or after age 25 years.  Meningococcal vaccine. Adults with asplenia or persistent complement component deficiencies should receive 2 doses of quadrivalent meningococcal conjugate (MenACWY-D) vaccine. The doses should be obtained at least 2 months apart. Microbiologists working with certain meningococcal bacteria, [place] recruits, people at risk during an outbreak, and people who travel to or live in countries with a high rate of meningitis should be immunized. A first-year college student up through age 60 years who is living in a residence hall should receive a dose if she did not receive a dose on or after her 16th birthday. Adults who have certain high-risk conditions should  receive one or more doses of vaccine.  Hepatitis A vaccine. Adults who wish to be protected from this disease, have certain high-risk conditions, work with hepatitis A-infected animals, work in hepatitis A research labs, or travel to or work in countries with a high rate of hepatitis A should be immunized. Adults who were previously unvaccinated and who anticipate close contact with an international adoptee during the first 60 days after arrival in the [place] from a country  with a high rate of hepatitis A should be immunized.  Hepatitis B vaccine.  Adults who wish to be protected from this disease, have certain high-risk conditions, may be exposed to blood or other infectious body fluids, are household contacts or sex partners of hepatitis B positive people, are clients or workers in certain care facilities, or travel to or work in countries with a high rate of hepatitis B should be immunized.  Haemophilus influenzae type b (Hib) vaccine. A previously unvaccinated person with asplenia or sickle cell disease or having a scheduled splenectomy should receive 1 dose of Hib vaccine. Regardless of previous immunization, a recipient of a hematopoietic stem cell transplant should receive a 3-dose series 6 12 months after her successful transplant. Hib vaccine is not recommended for adults with HIV infection.  Preventive Services / Frequency Ages 22 to 39years  Blood pressure check.** / Every 1 to 2 years.  Lipid and cholesterol check.** / Every 5 years beginning at age 76.  Clinical breast exam.** / Every 3 years for women in their 36s and 44s.  BRCA-related cancer risk assessment.** / For women who have family members with a BRCA-related cancer (breast, ovarian, tubal, or peritoneal cancers).  Pap test.** / Every 2 years from ages 66 through 21. Every 3 years starting at age 34 through age 23 or 26 with a history of 3 consecutive normal Pap tests.  HPV screening.** / Every 3 years from ages 54 through ages 41 to 78 with a history of 3 consecutive normal Pap tests.  Hepatitis C blood test.** / For any individual with known risks for hepatitis C.  Skin self-exam. / Monthly.  Influenza vaccine. / Every year.  Tetanus, diphtheria, and acellular pertussis (Tdap, Td) vaccine.** / Consult your health care provider. Pregnant women should receive 1 dose of Tdap vaccine during each pregnancy. 1 dose of Td every 10 years.  Varicella vaccine.** / Consult your health care  provider. Pregnant females who do not have evidence of immunity should receive the first dose after pregnancy.  HPV vaccine. / 3 doses over 6 months, if 71 and younger. The vaccine is not recommended for use in pregnant females. However, pregnancy testing is not needed before receiving a dose.  Measles, mumps, rubella (MMR) vaccine.** / You need at least 1 dose of MMR if you were born in 1957 or later. You may also need a 2nd dose. For females of childbearing age, rubella immunity should be determined. If there is no evidence of immunity, females who are not pregnant should be vaccinated. If there is no evidence of immunity, females who are pregnant should delay immunization until after pregnancy.  Pneumococcal 13-valent conjugate (PCV13) vaccine.** / Consult your health care provider.  Pneumococcal polysaccharide (PPSV23) vaccine.** / 1 to 2 doses if you smoke cigarettes or if you have certain conditions.  Meningococcal vaccine.** / 1 dose if you are age 72 to 48 years and a Market researcher living in a residence hall, or have one of several medical conditions, you need  to get vaccinated against meningococcal disease. You may also need additional booster doses.  Hepatitis A vaccine.** / Consult your health care provider.  Hepatitis B vaccine.** / Consult your health care provider.  Haemophilus influenzae type b (Hib) vaccine.** / Consult your health care provider.  Ages 62 to 64years  Blood pressure check.** / Every 1 to 2 years.  Lipid and cholesterol check.** / Every 5 years beginning at age 40 years.  Lung cancer screening. / Every year if you are aged 39 80 years and have a 30-pack-year history of smoking and currently smoke or have quit within the past 15 years. Yearly screening is stopped once you have quit smoking for at least 15 years or develop a health problem that would prevent you from having lung cancer treatment.  Clinical breast exam.** / Every year after age 44  years.  BRCA-related cancer risk assessment.** / For women who have family members with a BRCA-related cancer (breast, ovarian, tubal, or peritoneal cancers).  Mammogram.** / Every year beginning at age 54 years and continuing for as long as you are in good health. Consult with your health care provider.  Pap test.** / Every 3 years starting at age 58 years through age 7 or 13 years with a history of 3 consecutive normal Pap tests.  HPV screening.** / Every 3 years from ages 69 years through ages 37 to 35 years with a history of 3 consecutive normal Pap tests.  Fecal occult blood test (FOBT) of stool. / Every year beginning at age 40 years and continuing until age 27 years. You may not need to do this test if you get a colonoscopy every 10 years.  Flexible sigmoidoscopy or colonoscopy.** / Every 5 years for a flexible sigmoidoscopy or every 10 years for a colonoscopy beginning at age 52 years and continuing until age 23 years.  Hepatitis C blood test.** / For all people born from [birth year] through 1966 and any individual with known risks for hepatitis C.  Skin self-exam. / Monthly.  Influenza vaccine. / Every year.  Tetanus, diphtheria, and acellular pertussis (Tdap/Td) vaccine.** / Consult your health care provider. Pregnant women should receive 1 dose of Tdap vaccine during each pregnancy. 1 dose of Td every 10 years.  Varicella vaccine.** / Consult your health care provider. Pregnant females who do not have evidence of immunity should receive the first dose after pregnancy.  Zoster vaccine.** / 1 dose for adults aged 78 years or older.  Measles, mumps, rubella (MMR) vaccine.** / You need at least 1 dose of MMR if you were born in 1957 or later. You may also need a 2nd dose. For females of childbearing age, rubella immunity should be determined. If there is no evidence of immunity, females who are not pregnant should be vaccinated. If there is no evidence of immunity, females who are  pregnant should delay immunization until after pregnancy.  Pneumococcal 13-valent conjugate (PCV13) vaccine.** / Consult your health care provider.  Pneumococcal polysaccharide (PPSV23) vaccine.** / 1 to 2 doses if you smoke cigarettes or if you have certain conditions.  Meningococcal vaccine.** / Consult your health care provider.  Hepatitis A vaccine.** / Consult your health care provider.  Hepatitis B vaccine.** / Consult your health care provider.  Haemophilus influenzae type b (Hib) vaccine.** / Consult your health care provider.  Ages 26 years and over  Blood pressure check.** / Every 1 to 2 years.  Lipid and cholesterol check.** / Every 5 years beginning at age 69 years.  Lung cancer screening. / Every year if you are aged 52 80 years and have a 30-pack-year history of smoking and currently smoke or have quit within the past 15 years. Yearly screening is stopped once you have quit smoking for at least 15 years or develop a health problem that would prevent you from having lung cancer treatment.  Clinical breast exam.** / Every year after age 29 years.  BRCA-related cancer risk assessment.** / For women who have family members with a BRCA-related cancer (breast, ovarian, tubal, or peritoneal cancers).  Mammogram.** / Every year beginning at age 32 years and continuing for as long as you are in good health. Consult with your health care provider.  Pap test.** / Every 3 years starting at age 59 years through age 80 or 35 years with 3 consecutive normal Pap tests. Testing can be stopped between 65 and 70 years with 3 consecutive normal Pap tests and no abnormal Pap or HPV tests in the past 10 years.  HPV screening.** / Every 3 years from ages 89 years through ages 21 or 26 years with a history of 3 consecutive normal Pap tests. Testing can be stopped between 65 and 70 years with 3 consecutive normal Pap tests and no abnormal Pap or HPV tests in the past 10 years.  Fecal occult  blood test (FOBT) of stool. / Every year beginning at age 70 years and continuing until age 6 years. You may not need to do this test if you get a colonoscopy every 10 years.  Flexible sigmoidoscopy or colonoscopy.** / Every 5 years for a flexible sigmoidoscopy or every 10 years for a colonoscopy beginning at age 55 years and continuing until age 82 years.  Hepatitis C blood test.** / For all people born from [birth year] through 1966 and any individual with known risks for hepatitis C.  Osteoporosis screening.** / A one-time screening for women ages 4 years and over and women at risk for fractures or osteoporosis.  Skin self-exam. / Monthly.  Influenza vaccine. / Every year.  Tetanus, diphtheria, and acellular pertussis (Tdap/Td) vaccine.** / 1 dose of Td every 10 years.  Varicella vaccine.** / Consult your health care provider.  Zoster vaccine.** / 1 dose for adults aged 39 years or older.  Pneumococcal 13-valent conjugate (PCV13) vaccine.** / Consult your health care provider.  Pneumococcal polysaccharide (PPSV23) vaccine.** / 1 dose for all adults aged 54 years and older.  Meningococcal vaccine.** / Consult your health care provider.  Hepatitis A vaccine.** / Consult your health care provider.  Hepatitis B vaccine.** / Consult your health care provider.  Haemophilus influenzae type b (Hib) vaccine.** / Consult your health care provider. ** Family history and personal history of risk and conditions may change your health care provider's recommendations. Document Released: 10/30/2001 Document Revised: 06/24/2013  [facility] Patient Information 2014 [place], [place].   EXERCISE AND DIET:  We recommended that you start or continue a regular exercise program for good health. Regular exercise means any activity that makes your heart beat faster and makes you sweat.  We recommend exercising at least 30 minutes per day at least 3 days a week, preferably 5.  We also recommend a diet low in fat  and sugar / carbohydrates.  Inactivity, poor dietary choices and obesity can cause diabetes, heart attack, stroke, and kidney damage, among others.     ALCOHOL AND SMOKING:  Women should limit their alcohol intake to no more than 7 drinks/beers/glasses of wine (combined, not each!) per week. Moderation  of alcohol intake to this level decreases your risk of breast cancer and liver damage.  ( And of course, no recreational drugs are part of a healthy lifestyle.)  Also, you should not be smoking at all or even being exposed to second hand smoke. Most people know smoking can cause cancer, and various heart and lung diseases, but did you know it also contributes to weakening of your bones?  Aging of your skin?  Yellowing of your teeth and nails?   CALCIUM AND VITAMIN D:  Adequate intake of calcium and Vitamin D are recommended.  The recommendations for exact amounts of these supplements seem to change often, but generally speaking 600 mg of calcium (either carbonate or citrate) and 800 units of Vitamin D per day seems prudent. Certain women may benefit from higher intake of Vitamin D.  If you are among these women, your doctor will have told you during your visit.     PAP SMEARS:  Pap smears, to check for cervical cancer or precancers,  have traditionally been done yearly, although recent scientific advances have shown that most women can have pap smears less often.  However, every woman still should have a physical exam from her gynecologist or primary care physician every year. It will include a breast check, inspection of the vulva and vagina to check for abnormal growths or skin changes, a visual exam of the cervix, and then an exam to evaluate the size and shape of the uterus and ovaries.  And after 74 years of age, a rectal exam is indicated to check for rectal cancers. We will also provide age appropriate advice regarding health maintenance, like when you should have certain vaccines, screening for  sexually transmitted diseases, bone density testing, colonoscopy, mammograms, etc.    MAMMOGRAMS:  All women over 51 years old should have a yearly mammogram. Many facilities now offer a "3D" mammogram, which may cost around $50 extra out of pocket. If possible,  we recommend you accept the option to have the 3D mammogram performed.  It both reduces the number of women who will be called back for extra views which then turn out to be normal, and it is better than the routine mammogram at detecting truly abnormal areas.     COLONOSCOPY:  Colonoscopy to screen for colon cancer is recommended for all women at age 29.  We know, you hate the idea of the prep.  We agree, BUT, having colon cancer and not knowing it is worse!!  Colon cancer so often starts as a polyp that can be seen and removed at colonscopy, which can quite literally save your life!  And if your first colonoscopy is normal and you have no family history of colon cancer, most women don't have to have it again for 10 years.  Once every ten years, you can do something that may end up saving your life, right?  We will be happy to help you get it scheduled when you are ready.  Be sure to check your insurance coverage so you understand how much it will cost.  It may be covered as a preventative service at no cost, but you should check your particular policy.

## 2019-04-21 LAB — COMPREHENSIVE METABOLIC PANEL
ALT: 22 IU/L (ref 0–32)
AST: 24 IU/L (ref 0–40)
Albumin/Globulin Ratio: 1.8 (ref 1.2–2.2)
Albumin: 4.4 g/dL (ref 3.7–4.7)
Alkaline Phosphatase: 101 IU/L (ref 39–117)
BUN/Creatinine Ratio: 15 (ref 12–28)
BUN: 7 mg/dL — ABNORMAL LOW (ref 8–27)
Bilirubin Total: 0.4 mg/dL (ref 0.0–1.2)
CO2: 26 mmol/L (ref 20–29)
Calcium: 9.5 mg/dL (ref 8.7–10.3)
Chloride: 98 mmol/L (ref 96–106)
Creatinine, Ser: 0.47 mg/dL — ABNORMAL LOW (ref 0.57–1.00)
GFR calc Af Amer: 113 mL/min/{1.73_m2} (ref >59)
GFR calc non Af Amer: 98 mL/min/{1.73_m2} (ref >59)
Globulin, Total: 2.4 g/dL (ref 1.5–4.5)
Glucose: 86 mg/dL (ref 65–99)
Potassium: 3.8 mmol/L (ref 3.5–5.2)
Sodium: 140 mmol/L (ref 134–144)
Total Protein: 6.8 g/dL (ref 6.0–8.5)

## 2019-04-21 LAB — LIPID PANEL
Chol/HDL Ratio: 3 ratio (ref 0.0–4.4)
Cholesterol, Total: 212 mg/dL — ABNORMAL HIGH (ref 100–199)
HDL: 71 mg/dL (ref >39)
LDL Calculated: 128 mg/dL — ABNORMAL HIGH (ref 0–99)
Triglycerides: 67 mg/dL (ref 0–149)
VLDL Cholesterol Cal: 13 mg/dL (ref 5–40)

## 2019-04-21 LAB — CBC WITH DIFFERENTIAL/PLATELET
Basophils Absolute: 0 10*3/uL (ref 0.0–0.2)
Basos: 1 %
EOS (ABSOLUTE): 0.1 10*3/uL (ref 0.0–0.4)
Eos: 2 %
Hematocrit: 42.2 % (ref 34.0–46.6)
Hemoglobin: 14.2 g/dL (ref 11.1–15.9)
Immature Grans (Abs): 0 10*3/uL (ref 0.0–0.1)
Immature Granulocytes: 0 %
Lymphocytes Absolute: 1.7 10*3/uL (ref 0.7–3.1)
Lymphs: 39 %
MCH: 33.6 pg — ABNORMAL HIGH (ref 26.6–33.0)
MCHC: 33.6 g/dL (ref 31.5–35.7)
MCV: 100 fL — ABNORMAL HIGH (ref 79–97)
Monocytes Absolute: 0.5 10*3/uL (ref 0.1–0.9)
Monocytes: 12 %
Neutrophils Absolute: 1.9 10*3/uL (ref 1.4–7.0)
Neutrophils: 46 %
Platelets: 244 10*3/uL (ref 150–450)
RBC: 4.23 x10E6/uL (ref 3.77–5.28)
RDW: 13.2 % (ref 11.7–15.4)
WBC: 4.3 10*3/uL (ref 3.4–10.8)

## 2019-04-21 LAB — VITAMIN B12: Vitamin B-12: 433 pg/mL (ref 232–1245)

## 2019-04-21 LAB — HEMOGLOBIN A1C
Est. average glucose Bld gHb Est-mCnc: 100 mg/dL
Hgb A1c MFr Bld: 5.1 % (ref 4.8–5.6)

## 2019-04-21 LAB — TSH: TSH: 3.65 u[IU]/mL (ref 0.450–4.500)

## 2019-04-21 LAB — T4, FREE: Free T4: 1.21 ng/dL (ref 0.82–1.77)

## 2019-04-21 LAB — VITAMIN D 25 HYDROXY (VIT D DEFICIENCY, FRACTURES): Vit D, 25-Hydroxy: 44.9 ng/mL (ref 30.0–100.0)

## 2019-04-23 ENCOUNTER — Other Ambulatory Visit: Payer: Self-pay | Admitting: Family Medicine

## 2019-04-23 DIAGNOSIS — Z1231 Encounter for screening mammogram for malignant neoplasm of breast: Secondary | ICD-10-CM

## 2019-04-27 ENCOUNTER — Encounter: Payer: Self-pay | Admitting: Family Medicine

## 2019-04-27 ENCOUNTER — Other Ambulatory Visit: Payer: Self-pay

## 2019-04-27 ENCOUNTER — Ambulatory Visit (INDEPENDENT_AMBULATORY_CARE_PROVIDER_SITE_OTHER): Payer: Medicare Other | Admitting: Family Medicine

## 2019-04-27 VITALS — BP 125/73 | Ht 62.0 in | Wt 150.0 lb

## 2019-04-27 DIAGNOSIS — E7841 Elevated Lipoprotein(a): Secondary | ICD-10-CM

## 2019-04-27 DIAGNOSIS — E7889 Other lipoprotein metabolism disorders: Secondary | ICD-10-CM | POA: Diagnosis not present

## 2019-04-27 DIAGNOSIS — E559 Vitamin D deficiency, unspecified: Secondary | ICD-10-CM | POA: Diagnosis not present

## 2019-04-27 DIAGNOSIS — Z5181 Encounter for therapeutic drug level monitoring: Secondary | ICD-10-CM | POA: Diagnosis not present

## 2019-04-27 MED ORDER — ATORVASTATIN CALCIUM 10 MG PO TABS: 10.0000 mg | ORAL_TABLET | Freq: Every day | ORAL | 3 refills | Status: DC

## 2019-04-27 NOTE — Progress Notes (Signed)
Telehealth office visit note for Joann Wall, D.O- at [facility]   I connected with current patient today and verified that I am speaking with the correct person using two identifiers.   . Location of the patient: Home . Location of the provider: Office Only the patient (+/- their family members at pt's discretion) and myself were participating in the encounter    - This visit type was conducted due to national recommendations for restrictions regarding the COVID-19 Pandemic (e.g. social distancing) in an effort to limit this patient's exposure and mitigate transmission in our community.  This format is felt to be most appropriate for this patient at this time.   - The patient did not have access to video technology or had technical difficulties with video requiring transitioning to audio format only. - No physical exam could be performed with this format, beyond that communicated to [place] by the patient/ family members as noted.   - Additionally my office staff/ schedulers discussed with the patient that there may be a monetary charge related to this service, depending on their medical insurance.   The patient expressed understanding, and agreed to proceed.       History of Present Illness:   Today patient states she is fine today and ready to review recent lab work.  At home, patient states her blood pressure was 125/73 last check.  Notes she did not check her pulse.  Confirms she has never smoked.  Patient thinks she drinks about ten to twelve glasses of water per day.  Says "water is about all I drink," other than one cup of coffee in the morning.  Says she doesn't exercise as much as she should, but she does walk and stretch a lot.  Patient notes she "really doesn't want to start medications," but is willing to consider it today.  Patient drinks wine and likes to cook with wine.  Notes she loves to cook, cooking for herself most of the time.  States she  doesn't eat much red meat or pork.  Notes she has a little bit of an issue with her left knee, which has been replaced; "it has a bit of a catch in it."  Says she's been babying that knee and feels "it's probably time to have it replaced again since it's been 15 years.  I'm just hoping I don't have to have it replaced again."   Impression and Recommendations:    1. Elevated HDL   2. Vitamin D insufficiency   3. Elevated lipoprotein(a)   4. Medication monitoring encounter      - Reviewed recent lab work (04/20/2019) in depth with patient today.  All lab work within normal limits unless otherwise noted.  Extensive education was provided and all questions were answered.  Vitamin D Insufficiency - Stable last check at 44.9.   - Continue supplementation as prescribed.  See med list. - Will continue to monitor.  - Elevated Lipoprotein LDL = 128, elevated. HDL = 71. Triglycerides = 67.  - Discussed and recommended beginning low dose Lipitor.  See med list. - Counseling and extensive discussion held for starting meds.  All questions were answered. - Patient knows to take her medication at night before bed.  - When beginning medication, reviewed the importance of adequate hydration and physical activity to help diminish possible side effects of joint pain and myalgia.  - Prudent dietary changes such as low saturated & trans fat and low  carb diets discussed with patient.  Encouraged regular exercise and weight loss when appropriate.   - Return in 6-8 weeks for lab only visit to check liver enzymes.  Lifestyle & Preventative Health Maintenance - Advised patient to continue working toward exercising to improve overall mental, physical, and emotional health.    - Encouraged patient to engage in daily physical activity, especially a formal exercise routine.  Recommended that the patient eventually strive for at least 150 minutes of moderate cardiovascular activity per week according to  guidelines established by the [facility].   - Healthy dietary habits encouraged, including low-carb, and high amounts of lean protein in diet.   - Patient should also continue consuming adequate amounts of water.  Recommendations - Return in 4 months to re-check fasting labs and cholesterol levels.  - As part of my medical decision making, I reviewed the following data within the [place] History obtained from pt /family, CMA notes reviewed and incorporated if applicable, Labs reviewed, Radiograph/ tests reviewed if applicable and OV notes from prior OV's with me, as well as other specialists she/he has seen since seeing me last, were all reviewed and used in my medical decision making process today.   - Additionally, discussion had with patient regarding txmnt plan, and their biases/concerns about that plan were used in my medical decision making today.   - The patient agreed with the plan and demonstrated an understanding of the instructions.   No barriers to understanding were identified.   - Red flag symptoms and signs discussed in detail.  Patient expressed understanding regarding what to do in case of emergency\ urgent symptoms.  The patient was advised to call back or seek an in-person evaluation if the symptoms worsen or if the condition fails to improve as anticipated.   Return for 6-8wk ALT lab only, 52mo FLP w OV 3-5 d later.    Orders Placed This Encounter  Procedures  . ALT    Meds ordered this encounter  Medications  . atorvastatin (LIPITOR) 10 MG tablet    Sig: Take 1 tablet (10 mg total) by mouth at bedtime.    Dispense:  90 tablet    Refill:  3    Medications Discontinued During This Encounter  Medication Reason  . Cyanocobalamin (VITAMIN B 12 PO) Patient Preference      I provided 20 minutes of non-face-to-face time during this encounter,with over 50% of the time in direct counseling on patients medical conditions/ medical concerns.  Additional time  was spent with charting and coordination of care after the actual visit commenced.   Note:  This note was prepared with assistance of Dragon voice recognition software. Occasional wrong-word or sound-a-like substitutions may have occurred due to the inherent limitations of voice recognition software.  This document serves as a record of services personally performed by Joann Dance, DO. It was created on her behalf by [name], a trained medical scribe. The creation of this record is based on the scribe's personal observations and the provider's statements to them.   I have reviewed the above medical documentation for accuracy and completeness and I concur.  Joann Dance, DO 04/27/2019 8:34 AM        Patient Care Team    Relationship Specialty Notifications Start End  Joann Dance, DO PCP - General Family Medicine  04/09/16   Jari Pigg, MD Consulting Physician Dermatology  05/14/16   Warden Fillers, MD Consulting Physician Ophthalmology  04/20/19      -Vitals  obtained; medications/ allergies reconciled;  personal medical, social, Sx etc.histories were updated by CMA, reviewed by me and are reflected in chart   Patient Active Problem List   Diagnosis Date Noted  . Elevated blood pressure (not hypertension) 04/09/2016    Priority: High  . Overweight (BMI 25.0-29.9) 04/09/2016    Priority: High  . Vitamin D insufficiency 04/09/2016    Priority: Medium  . Elevated HDL 06/16/2016  . S/P bilateral breast reduction- 2000 04/09/2016  . Status post left knee replacement- 2004 04/09/2016  . Contusion, R foot- sees dr Delilah Shan- [facility] 04/09/2016   Recent Results (from the past 2160 hour(s))  CBC with Differential/Platelet     Status: Abnormal   Collection Time: 04/20/19  9:18 AM  Result Value Ref Range   WBC 4.3 3.4 - 10.8 x10E3/uL   RBC 4.23 3.77 - 5.28 x10E6/uL   Hemoglobin 14.2 11.1 - 15.9 g/dL   Hematocrit 42.2 34.0 - 46.6 %   MCV 100 (H) 79 - 97 fL    MCH 33.6 (H) 26.6 - 33.0 pg   MCHC 33.6 31.5 - 35.7 g/dL   RDW 13.2 11.7 - 15.4 %   Platelets 244 150 - 450 x10E3/uL   Neutrophils 46 Not Estab. %   Lymphs 39 Not Estab. %   Monocytes 12 Not Estab. %   Eos 2 Not Estab. %   Basos 1 Not Estab. %   Neutrophils Absolute 1.9 1.4 - 7.0 x10E3/uL   Lymphocytes Absolute 1.7 0.7 - 3.1 x10E3/uL   Monocytes Absolute 0.5 0.1 - 0.9 x10E3/uL   EOS (ABSOLUTE) 0.1 0.0 - 0.4 x10E3/uL   Basophils Absolute 0.0 0.0 - 0.2 x10E3/uL   Immature Granulocytes 0 Not Estab. %   Immature Grans (Abs) 0.0 0.0 - 0.1 x10E3/uL  Comprehensive metabolic panel     Status: Abnormal   Collection Time: 04/20/19  9:18 AM  Result Value Ref Range   Glucose 86 65 - 99 mg/dL   BUN 7 (L) 8 - 27 mg/dL   Creatinine, Ser 0.47 (L) 0.57 - 1.00 mg/dL   GFR calc non Af Amer 98 >59 mL/min/1.73   GFR calc Af Amer 113 >59 mL/min/1.73   BUN/Creatinine Ratio 15 12 - 28   Sodium 140 134 - 144 mmol/L   Potassium 3.8 3.5 - 5.2 mmol/L   Chloride 98 96 - 106 mmol/L   CO2 26 20 - 29 mmol/L   Calcium 9.5 8.7 - 10.3 mg/dL   Total Protein 6.8 6.0 - 8.5 g/dL   Albumin 4.4 3.7 - 4.7 g/dL   Globulin, Total 2.4 1.5 - 4.5 g/dL   Albumin/Globulin Ratio 1.8 1.2 - 2.2   Bilirubin Total 0.4 0.0 - 1.2 mg/dL   Alkaline Phosphatase 101 39 - 117 IU/L   AST 24 0 - 40 IU/L   ALT 22 0 - 32 IU/L  Hemoglobin A1c     Status: None   Collection Time: 04/20/19  9:18 AM  Result Value Ref Range   Hgb A1c MFr Bld 5.1 4.8 - 5.6 %    Comment:          Prediabetes: 5.7 - 6.4          Diabetes: >6.4          Glycemic control for adults with diabetes: <7.0    Est. average glucose Bld gHb Est-mCnc 100 mg/dL  Lipid panel     Status: Abnormal   Collection Time: 04/20/19  9:18 AM  Result Value Ref  Range   Cholesterol, Total 212 (H) 100 - 199 mg/dL   Triglycerides 67 0 - 149 mg/dL   HDL 71 >39 mg/dL   VLDL Cholesterol Cal 13 5 - 40 mg/dL   LDL Calculated 128 (H) 0 - 99 mg/dL   Chol/HDL Ratio 3.0 0.0 - 4.4 ratio     Comment:                                   T. Chol/HDL Ratio                                             Men  Women                               1/2 Avg.Risk  3.4    3.3                                   Avg.Risk  5.0    4.4                                2X Avg.Risk  9.6    7.1                                3X Avg.Risk 23.4   11.0   VITAMIN D 25 Hydroxy (Vit-D Deficiency, Fractures)     Status: None   Collection Time: 04/20/19  9:18 AM  Result Value Ref Range   Vit D, 25-Hydroxy 44.9 30.0 - 100.0 ng/mL    Comment: Vitamin D deficiency has been defined by the [place] practice guideline as a level of serum 25-OH vitamin D less than 20 ng/mL (1,2). The Endocrine Society went on to further define vitamin D insufficiency as a level between 21 and 29 ng/mL (2). 1. IOM (Institute of Medicine). 2010. Dietary reference    intakes for calcium and D. [place]: The    [employer]. 2. Holick MF, Binkley Purple Sage, Bischoff-Ferrari HA, et al.    Evaluation, treatment, and prevention of vitamin D    deficiency: an Endocrine Society clinical practice    guideline. JCEM. 2011 Jul; 96(7):1911-30.   TSH     Status: None   Collection Time: 04/20/19  9:18 AM  Result Value Ref Range   TSH 3.650 0.450 - 4.500 uIU/mL  T4, free     Status: None   Collection Time: 04/20/19  9:18 AM  Result Value Ref Range   Free T4 1.21 0.82 - 1.77 ng/dL  B12     Status: None   Collection Time: 04/20/19  9:18 AM  Result Value Ref Range   Vitamin B-12 433 232 - 1,245 pg/mL    Current Meds  Medication Sig  . BIOTIN PO Take 1 tablet by mouth daily.  . Calcium Carbonate (CALTRATE 600 PO) Take by mouth daily.  . Cholecalciferol (VITAMIN D3) 1000 units CAPS Take by mouth daily.  Marland Kitchen ibuprofen (ADVIL,MOTRIN) 400 MG tablet Take 1 tablet (400 mg total) by mouth every 6 (  six) hours as needed for mild pain or moderate pain.   Current Facility-Administered Medications for  the 04/27/19 encounter (Office Visit) with Joann Dance, DO  Medication  . 0.9 %  sodium chloride infusion     Allergies:  Allergies  Allergen Reactions  . Sulfa Antibiotics Other (See Comments)    disoriented     ROS:  See above HPI for pertinent positives and negatives   Objective:   Blood pressure 125/73, height 5\' 2"  (1.575 m), weight 150 lb (68 kg).  (if some vitals are omitted, this means that patient was UNABLE to obtain them even though they were asked to get them prior to OV today.  They were asked to call us at their earliest convenience with these once obtained. )  General: A & O * 3; sounds in no acute distress; in usual state of health.  Skin: Pt confirms warm and dry extremities and pink fingertips HEENT: Pt confirms lips non-cyanotic Chest: Patient confirms normal chest excursion and movement Respiratory: speaking in full sentences, no conversational dyspnea; patient confirms no use of accessory muscles Psych: insight appears good, mood- appears full

## 2019-04-28 ENCOUNTER — Telehealth: Payer: Self-pay | Admitting: Family Medicine

## 2019-04-28 NOTE — Telephone Encounter (Signed)
Patient called stating that she was started on Lipitor yesterday but pharm gave her generic and she would like name brand. She was advised to contact PCP for new order. If approved please send to [facility].

## 2019-04-28 NOTE — Telephone Encounter (Signed)
Spoke to pharmacy patient needs to just let them know that she wants name brand as it has to be ordered. Per pharmacy can only get in 90 tabs and the cost is over $1K and insurance does not covered.  Called patient and left message to call the office back. MPulliam, CMA/RT(R)

## 2019-06-08 ENCOUNTER — Other Ambulatory Visit: Payer: Self-pay

## 2019-06-08 ENCOUNTER — Other Ambulatory Visit: Payer: Medicare Other

## 2019-06-08 DIAGNOSIS — E7841 Elevated Lipoprotein(a): Secondary | ICD-10-CM | POA: Diagnosis not present

## 2019-06-08 DIAGNOSIS — Z5181 Encounter for therapeutic drug level monitoring: Secondary | ICD-10-CM | POA: Diagnosis not present

## 2019-06-08 DIAGNOSIS — E7889 Other lipoprotein metabolism disorders: Secondary | ICD-10-CM

## 2019-06-09 LAB — ALT: ALT: 18 IU/L (ref 0–32)

## 2019-07-06 ENCOUNTER — Other Ambulatory Visit: Payer: Self-pay

## 2019-07-06 ENCOUNTER — Ambulatory Visit
Admission: RE | Admit: 2019-07-06 | Discharge: 2019-07-06 | Disposition: A | Discharge status: Home / Self Care | Payer: Medicare Other | Source: Ambulatory Visit | Attending: Family Medicine | Admitting: Family Medicine

## 2019-07-06 DIAGNOSIS — Z1231 Encounter for screening mammogram for malignant neoplasm of breast: Secondary | ICD-10-CM | POA: Diagnosis not present

## 2019-07-06 DIAGNOSIS — M85851 Other specified disorders of bone density and structure, right thigh: Secondary | ICD-10-CM | POA: Diagnosis not present

## 2019-07-06 DIAGNOSIS — E2839 Other primary ovarian failure: Secondary | ICD-10-CM

## 2019-07-06 DIAGNOSIS — M85852 Other specified disorders of bone density and structure, left thigh: Secondary | ICD-10-CM | POA: Diagnosis not present

## 2019-07-14 DIAGNOSIS — Z23 Encounter for immunization: Secondary | ICD-10-CM | POA: Diagnosis not present

## 2019-08-26 ENCOUNTER — Other Ambulatory Visit: Payer: Self-pay

## 2019-08-26 ENCOUNTER — Ambulatory Visit (INDEPENDENT_AMBULATORY_CARE_PROVIDER_SITE_OTHER): Payer: Medicare Other | Admitting: Family Medicine

## 2019-08-26 ENCOUNTER — Encounter: Payer: Self-pay | Admitting: Family Medicine

## 2019-08-26 VITALS — BP 134/82

## 2019-08-26 DIAGNOSIS — M5432 Sciatica, left side: Secondary | ICD-10-CM | POA: Diagnosis not present

## 2019-08-26 DIAGNOSIS — E785 Hyperlipidemia, unspecified: Secondary | ICD-10-CM

## 2019-08-26 DIAGNOSIS — R5383 Other fatigue: Secondary | ICD-10-CM | POA: Insufficient documentation

## 2019-08-26 DIAGNOSIS — E7889 Other lipoprotein metabolism disorders: Secondary | ICD-10-CM

## 2019-08-26 DIAGNOSIS — R52 Pain, unspecified: Secondary | ICD-10-CM | POA: Diagnosis not present

## 2019-08-26 DIAGNOSIS — Z7189 Other specified counseling: Secondary | ICD-10-CM

## 2019-08-26 DIAGNOSIS — R7309 Other abnormal glucose: Secondary | ICD-10-CM | POA: Insufficient documentation

## 2019-08-26 DIAGNOSIS — R030 Elevated blood-pressure reading, without diagnosis of hypertension: Secondary | ICD-10-CM

## 2019-08-26 DIAGNOSIS — E559 Vitamin D deficiency, unspecified: Secondary | ICD-10-CM

## 2019-08-26 MED ORDER — VITAMIN D3 50 MCG (2000 UT) PO CAPS: 2000.0000 [IU] | ORAL_CAPSULE | Freq: Every day | ORAL | Status: AC

## 2019-08-26 MED ORDER — CYCLOBENZAPRINE HCL 10 MG PO TABS: 10.0000 mg | ORAL_TABLET | Freq: Three times a day (TID) | ORAL | 0 refills | Status: DC | PRN

## 2019-08-26 MED ORDER — PREDNISONE 20 MG PO TABS: ORAL_TABLET | ORAL | 0 refills | Status: DC

## 2019-08-26 NOTE — Progress Notes (Signed)
Telehealth office visit note for Joann Wall, D.O- at Primary Care at [facility]   I connected with current patient today and verified that I am speaking with the correct person using two identifiers.    Location of the patient: Home  Location of the provider: Office Only the patient (+/- their family members at pt's discretion) and myself were participating in the encounter - This visit type was conducted due to national recommendations for restrictions regarding the COVID-19 Pandemic (e.g. social distancing) in an effort to limit this patient's exposure and mitigate transmission in our community.  This format is felt to be most appropriate for this patient at this time.   - The patient did not have access to video technology or had technical difficulties with video requiring transitioning to audio format only. - No physical exam could be performed with this format, beyond that communicated to [place] by the patient/ family members as noted.   - Additionally my office staff/ schedulers discussed with the patient that there may be a monetary charge related to this service, depending on their medical insurance.   The patient expressed understanding, and agreed to proceed.       History of Present Illness: I, Joann Wall, am serving as scribe for Dr. Mellody Wall.  - Left-Sided Sciatica For the past 3-4 weeks, notes she has had sciatica badly and is in excruciating pain.  Notes her pain is on the left side.  "I just woke up one morning and it was hurting, and it's just been constant."  Says she's been cooking a lot, but is unsure if that would have caused her pain.  She's had sciatica before in the past, "but not this bad."  She never sought treatment for sciatica in the past, "but I have gone to Dr. Delilah Wall for when I had a torn rotator cuff."  Pain starts in left side of her buttock and goes down her thigh past her knee.  Says the pain goes "not quite to my ankle,"  confirming travel to above the ankle.  Pain is on the outside.  Denies loss of function of the leg, "but it's pretty painful walking; I'm limping a lot."  "It's causing my walk to not be even."  Notes "hurts when I'm lying down, not so much when I'm sitting, but when I'm lying down or walking, it's just so painful."  Worse with "just moving around."  Was with her son over Thanksgiving and notes "it was just miserable."  Has been trying to stretch and relieve some of it, but doing nothing other than this.  She has never taken steroids for this in the past.  Notes "I probably should have sought medical attention" in the past, "but I didn't know if there was anything that could be done for it."  Patient notes she doesn't take anything for pain relief, not even NSAID's.  HPI:  Hyperlipidemia:  73 y.o. female here for cholesterol follow-up.   - Patient reports good compliance with treatment plan of:  medication and/ or lifestyle management.    Notes "doing fine" on statin management, and "I don't notice anything."  Says she takes it every night and hasn't noticed any side-effects.  Confirms she continues drinking plenty of water; "I just keep filtered water in my refrigerator all the time."  - Patient denies any acute concerns or problems with management plan   - She denies new onset of: myalgias, arthralgias, increased fatigue more than normal,  chest pains, exercise intolerance, shortness of breath, dizziness, visual changes, headache, lower extremity swelling or claudication.   Most recent cholesterol panel was:  Lab Results  Component Value Date   CHOL 212 (H) 04/20/2019   HDL 71 04/20/2019   LDLCALC 128 (H) 04/20/2019   TRIG 67 04/20/2019   CHOLHDL 3.0 04/20/2019   Hepatic Function Latest Ref Rng & Units 06/08/2019 04/20/2019 05/14/2016  Total Protein 6.0 - 8.5 g/dL - 6.8 6.6  Albumin 3.7 - 4.7 g/dL - 4.4 4.2  AST 0 - 40 IU/L - 24 21  ALT 0 - 32 IU/L 18 22 13   Alk Phosphatase  39 - 117 IU/L - 101 84  Total Bilirubin 0.0 - 1.2 mg/dL - 0.4 0.7    Home Blood Pressure  -  Her blood pressure at home has been running:  128-138/77-80.  Notes she has not noticed where her pulse is running at home.  - She denies new onset of: chest pain, exercise intolerance, shortness of breath, dizziness, visual changes, headache, lower extremity swelling or claudication.   Last 3 blood pressure readings in our office are as follows: BP Readings from Last 3 Encounters:  08/26/19 134/82  04/27/19 125/73  04/20/19 (!) 143/78   There were no vitals filed for this visit.     GAD 7 : Generalized Anxiety Score 04/27/2019  Nervous, Anxious, on Edge 0  Control/stop worrying 0  Worry too much - different things 0  Trouble relaxing 0  Restless 0  Easily annoyed or irritable 0  Afraid - awful might happen 0  Total GAD 7 Score 0  Anxiety Difficulty Not difficult at all    Depression screen [facility] 2/9 08/26/2019 04/27/2019 04/20/2019 04/09/2016  Decreased Interest 0 0 0 0  Down, Depressed, Hopeless 0 0 0 0  PHQ - 2 Score 0 0 0 0  Altered sleeping 3 0 0 -  Tired, decreased energy 0 0 0 -  Change in appetite 0 0 0 -  Feeling bad or failure about yourself  0 0 0 -  Trouble concentrating 0 0 0 -  Moving slowly or fidgety/restless 0 0 0 -  Suicidal thoughts 0 0 0 -  PHQ-9 Score 3 0 0 -  Difficult doing work/chores Not difficult at all Not difficult at all Not difficult at all -      Impression and Recommendations:    1. Hyperlipidemia, unspecified hyperlipidemia type   2. Elevated HDL   3. Vitamin D insufficiency   4. Sciatica of left side-   acute episode on chronic   5. Acute pain   6. h/o Elevated blood pressure reading without diagnosis of hypertension   7. Encounter for medication review and counseling      Sciatica of Left Side - Acute Episode on Chronic; Acute Pain - Symptoms reviewed extensively with patient today. - Lengthy discussion held.  Health counseling  provided and all questions answered. - Reviewed possible causes of sciatic pain, and treatment alternatives.  - Discussed indications for acute treatment with steroid taper for 7-10 days. - Extensively explained risks and benefits of steroid management. - Reviewed possible S-E, including such as increased energy and insomnia.  - Steroid taper provided today.  See med list. - Patient knows to take prescription as written.   - Told patient to avoid use of NSAID's while managed on steroids.  - Discussed addition of muscle relaxers to treatment plan if desired. - Reviewed risks and benefits of cyclobenzaprine today. - Advised patient  that S-E may include sleepiness / grogginess. - Flexeril provided today.  See med list.  - Strongly advised patient to follow up with Dr. Delilah Wall of Orthopedics. - Ambulatory referral to Orthopedics provided today.  - Extensive education provided. - Will continue to monitor.  History of Elevated BP without diagnosis of HTN - Blood pressures have been well-controlled at home. - Per patient, no concerns or problems. - Encouraged patient to continue to monitor her BP, even while in acute pain.  Hyperlipidemia - Elevated Lipoprotein(a), Elevated HDL - Cholesterol last checked on 04/20/2019. - Patient recently agreed to begin statin management.  - Has been managed on statin since ~04/27/2019. - Liver enzymes WNL last check. - Need for FLP re-check.  - Patient will continue current treatment regimen.  See med list. - Patient tolerating statin well without S-E.  - To continue to reduce incidence of S-E on statin, encouraged patient to continue to drink adequate amounts of water and engage in regular physical activity.  - Encouraged patient to follow AHA guidelines for regular exercise and also engage in weight loss if BMI above 25.   - We will continue to monitor and re-check as noted.  Vitamin D Insufficiency - Told patient to take Vitamin D 2000 IU's  daily OTC in winter. - Will continue to monitor and re-check as recommended.  Recommendations - Need for re-check FLP near future. - OV in 4-6 months otherwise.   - Novel Covid -19 counseling done; all questions were answered.   - Current CDC / federal and [place] guidelines reviewed with patient  - Reminded pt of extreme importance of social distancing; wearing a mask when out in public; insensate handwashing and cleaning of surfaces, avoiding unnecessary trips for shopping and avoiding ALL but emergency appts etc. - Told patient to be prepared, not scared; and be smart for the sake of others. - Reviewed difference between high-risk and low-risk exposures. - Patient will call with any additional concerns    - As part of my medical decision making, I reviewed the following data within the [place] History obtained from pt /family, CMA notes reviewed and incorporated if applicable, Labs reviewed, Radiograph/ tests reviewed if applicable and OV notes from prior OV's with me, as well as other specialists she/he has seen since seeing me last, were all reviewed and used in my medical decision making process today.    - Additionally, discussion had with patient regarding our treatment plan, and their biases/concerns about that plan were used in my medical decision making today.    - The patient agreed with the plan and demonstrated an understanding of the instructions.   No barriers to understanding were identified.       Return for FLP near future, OV in 4-6 months.    Orders Placed This Encounter  Procedures   Lipid panel   Ambulatory referral to Orthopedic Surgery    Meds ordered this encounter  Medications   predniSONE (DELTASONE) 20 MG tablet    Sig: 2 pills a day for 3 days, 1 pill a day for 3 days then one half pill a day for 3 days then off    Dispense:  14 tablet    Refill:  0   cyclobenzaprine (FLEXERIL) 10 MG tablet    Sig: Take 1 tablet (10  mg total) by mouth 3 (three) times daily as needed for muscle spasms.    Dispense:  30 tablet    Refill:  0  Cholecalciferol (VITAMIN D3) 50 MCG (2000 UT) capsule    Sig: Take 1 capsule (2,000 Units total) by mouth daily.    Dispense:       Medications Discontinued During This Encounter  Medication Reason   Cholecalciferol (VITAMIN D3) 1000 units CAPS Error      I provided 21+ minutes of non face-to-face time during this encounter.  Additional time was spent with charting and coordination of care after the actual visit commenced.   Note:  This note was prepared with assistance of Dragon voice recognition software. Occasional wrong-word or sound-a-like substitutions may have occurred due to the inherent limitations of voice recognition software.  This document serves as a record of services personally performed by Joann Dance, DO. It was created on her behalf by Joann Wall, a trained medical scribe. The creation of this record is based on the scribe's personal observations and the provider's statements to them.   This case required medical decision making of at least moderate complexity. The above documentation has been reviewed to be accurate and was completed by Marjory Sneddon, D.O.       Patient Care Team    Relationship Specialty Notifications Start End  Joann Dance, DO PCP - General Family Medicine  04/09/16   Jari Pigg, MD Consulting Physician Dermatology  05/14/16   Warden Fillers, MD Consulting Physician Ophthalmology  04/20/19      -Vitals obtained; medications/ allergies reconciled;  personal medical, social, Sx etc.histories were updated by CMA, reviewed by me and are reflected in chart   Patient Active Problem List   Diagnosis Date Noted   Elevated blood pressure (not hypertension) 04/09/2016   Overweight (BMI 25.0-29.9) 04/09/2016   Vitamin D insufficiency 04/09/2016   Hyperlipidemia 08/26/2019   Elevated glucose 08/26/2019    Fatigue 08/26/2019   Sciatica of left side-   acute episode on chronic 08/26/2019   Acute pain 08/26/2019   Elevated HDL 06/16/2016   S/P bilateral breast reduction- 2000 04/09/2016   Status post left knee replacement- 2004 04/09/2016   Contusion, R foot- sees dr Joann Wall- [facility] 04/09/2016     Current Meds  Medication Sig   atorvastatin (LIPITOR) 10 MG tablet Take 1 tablet (10 mg total) by mouth at bedtime.   BIOTIN PO Take 1 tablet by mouth daily.   Calcium Carbonate (CALTRATE 600 PO) Take by mouth daily.   ibuprofen (ADVIL,MOTRIN) 400 MG tablet Take 1 tablet (400 mg total) by mouth every 6 (six) hours as needed for mild pain or moderate pain.   Current Facility-Administered Medications for the 08/26/19 encounter (Office Visit) with Joann Dance, DO  Medication   0.9 %  sodium chloride infusion     Allergies:  Allergies  Allergen Reactions   Sulfa Antibiotics Other (See Comments)    disoriented     ROS:  See above HPI for pertinent positives and negatives   Objective:   Blood pressure 134/82.  (if some vitals are omitted, this means that patient was UNABLE to obtain them even though they were asked to get them prior to OV today.  They were asked to call us at their earliest convenience with these once obtained. )  General: A & O * 3; sounds in no acute distress; in usual state of health.  Skin: Pt confirms warm and dry extremities and pink fingertips HEENT: Pt confirms lips non-cyanotic Chest: Patient confirms normal chest excursion and movement Respiratory: speaking in full sentences, no conversational dyspnea; patient confirms no use of accessory  muscles Psych: insight appears good, mood- appears full

## 2019-08-28 DIAGNOSIS — M47896 Other spondylosis, lumbar region: Secondary | ICD-10-CM | POA: Diagnosis not present

## 2019-08-28 DIAGNOSIS — M79605 Pain in left leg: Secondary | ICD-10-CM | POA: Insufficient documentation

## 2019-08-28 DIAGNOSIS — M545 Low back pain, unspecified: Secondary | ICD-10-CM | POA: Insufficient documentation

## 2019-08-28 DIAGNOSIS — M47816 Spondylosis without myelopathy or radiculopathy, lumbar region: Secondary | ICD-10-CM | POA: Insufficient documentation

## 2019-08-31 ENCOUNTER — Other Ambulatory Visit: Payer: Medicare Other

## 2019-08-31 ENCOUNTER — Other Ambulatory Visit: Payer: Self-pay

## 2019-08-31 DIAGNOSIS — E7889 Other lipoprotein metabolism disorders: Secondary | ICD-10-CM

## 2019-08-31 DIAGNOSIS — Z7189 Other specified counseling: Secondary | ICD-10-CM | POA: Diagnosis not present

## 2019-08-31 DIAGNOSIS — E785 Hyperlipidemia, unspecified: Secondary | ICD-10-CM

## 2019-08-31 DIAGNOSIS — E7841 Elevated Lipoprotein(a): Secondary | ICD-10-CM

## 2019-09-01 LAB — LIPID PANEL
Chol/HDL Ratio: 1.9 ratio (ref 0.0–4.4)
Cholesterol, Total: 177 mg/dL (ref 100–199)
HDL: 92 mg/dL (ref >39)
LDL Chol Calc (NIH): 74 mg/dL (ref 0–99)
Triglycerides: 58 mg/dL (ref 0–149)
VLDL Cholesterol Cal: 11 mg/dL (ref 5–40)

## 2019-09-04 ENCOUNTER — Other Ambulatory Visit: Payer: Medicare Other

## 2020-02-09 ENCOUNTER — Other Ambulatory Visit: Payer: Self-pay | Admitting: Physician Assistant

## 2020-02-09 DIAGNOSIS — M5432 Sciatica, left side: Secondary | ICD-10-CM

## 2020-02-09 DIAGNOSIS — R52 Pain, unspecified: Secondary | ICD-10-CM

## 2020-02-09 NOTE — Telephone Encounter (Signed)
I am unsure if pt should continue this medication as she has not been seen for this problem since 08/25/2020.  Please review and refill if appropriate.  Charyl Bigger, CMA

## 2020-03-18 IMAGING — MG DIGITAL SCREENING BILAT W/ TOMO W/ CAD
8 series · 8 of 24 positions shown · non-contrast
Comparison: Previous exam(s).

CLINICAL DATA: Screening.

EXAM:
DIGITAL SCREENING BILATERAL MAMMOGRAM WITH TOMO AND CAD

[L MLO synth-2D]
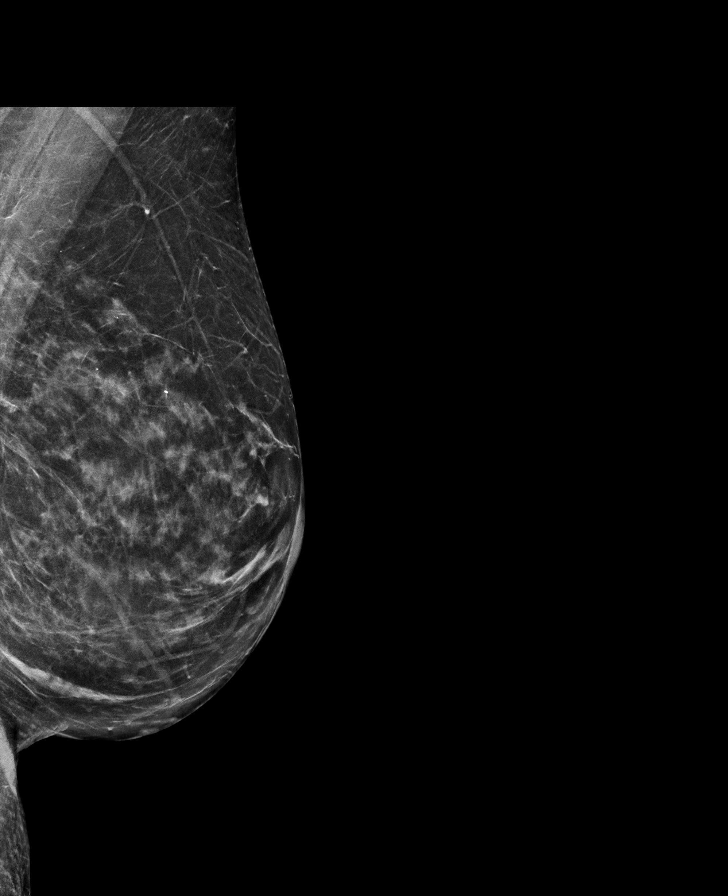

[L CC synth-2D]
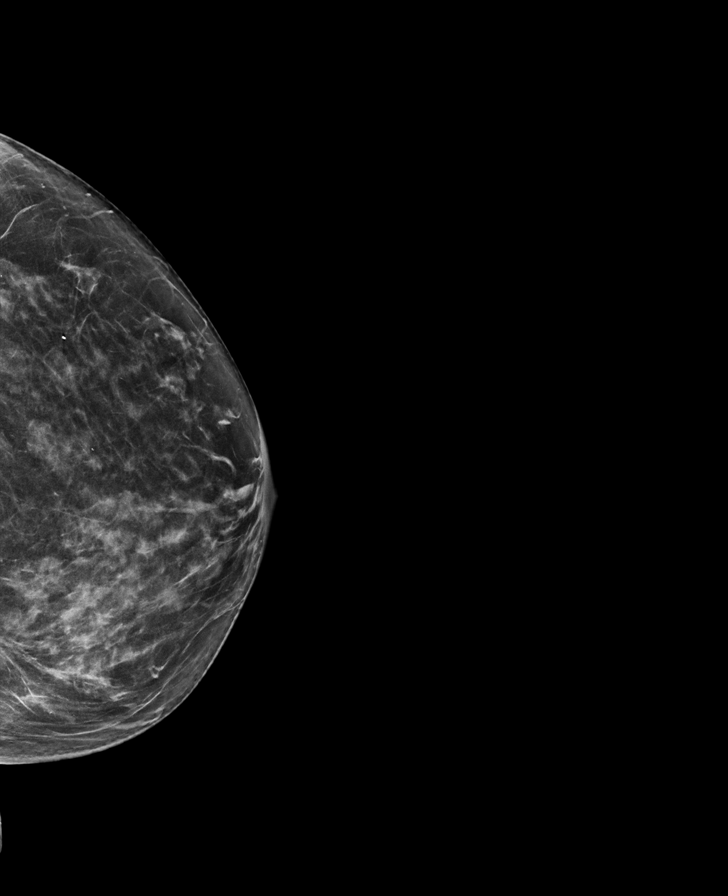

[R MLO synth-2D]
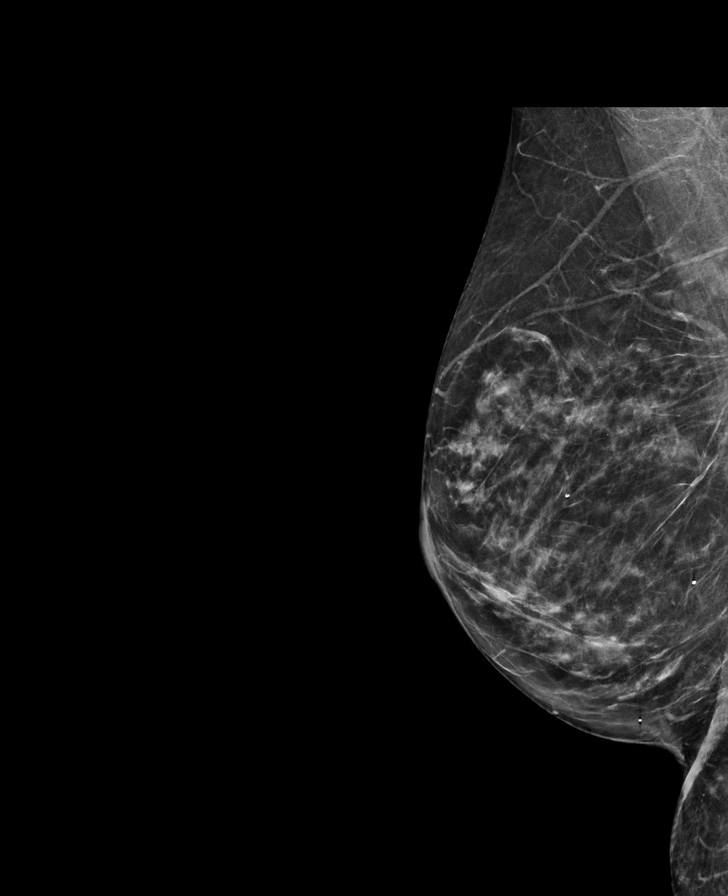

[R CC synth-2D]
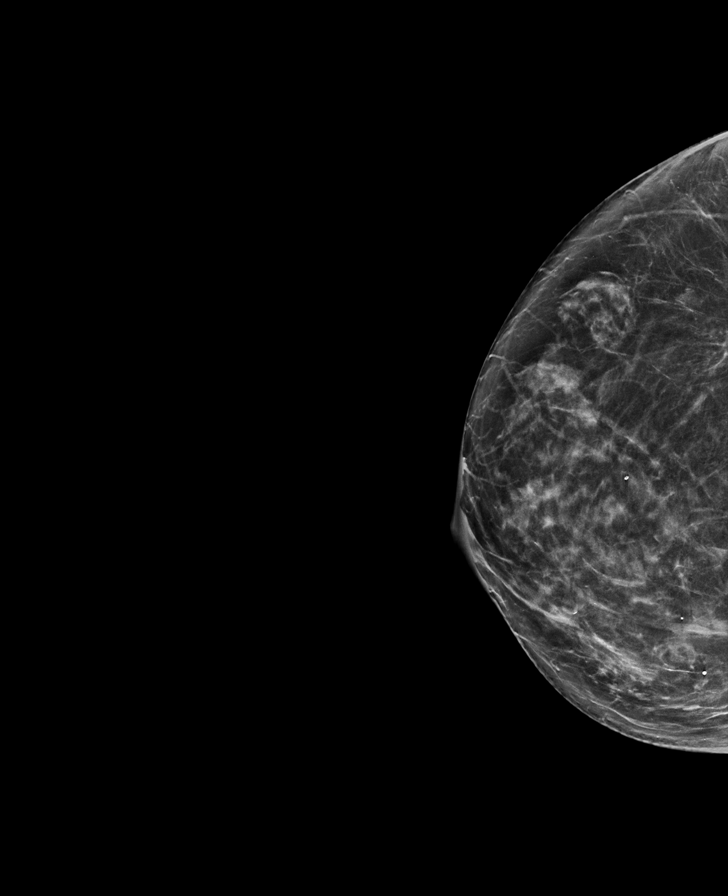

[R CC tomo · tomo slice 33/65.0]
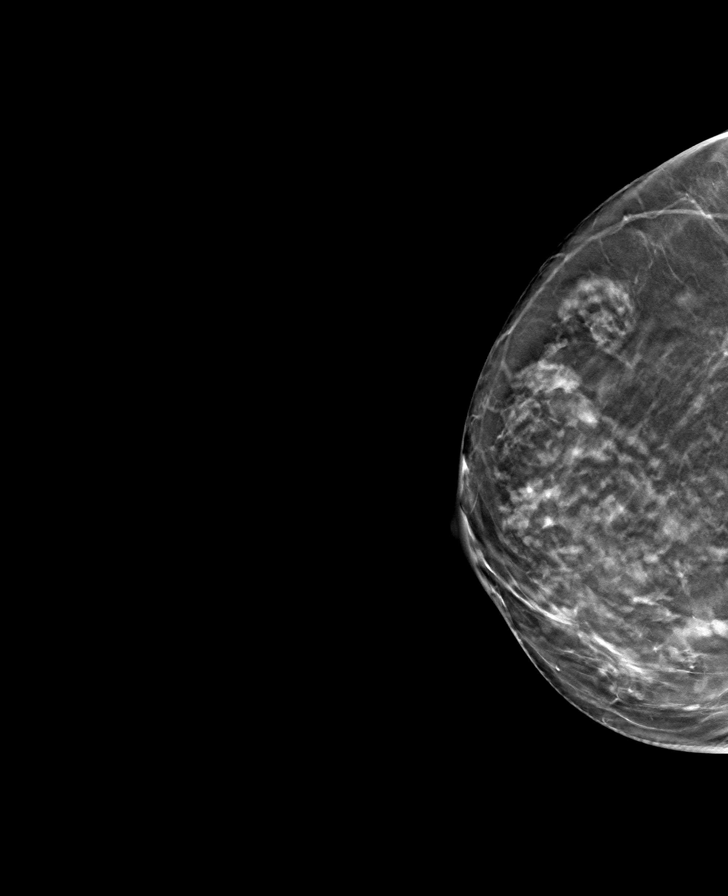

[R MLO tomo · tomo slice 33/64.0]
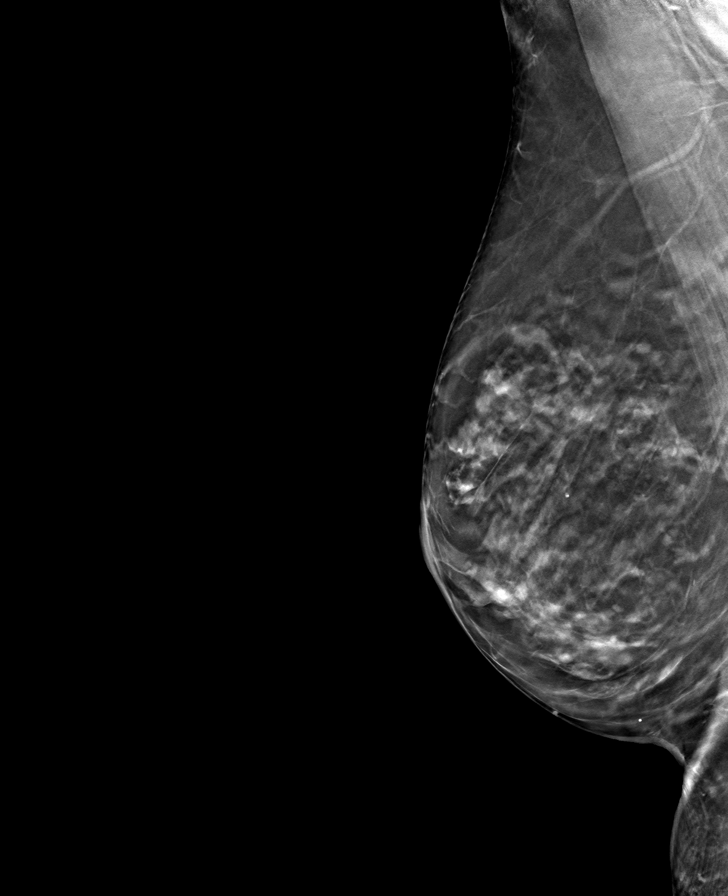

[L CC tomo · tomo slice 33/65.0]
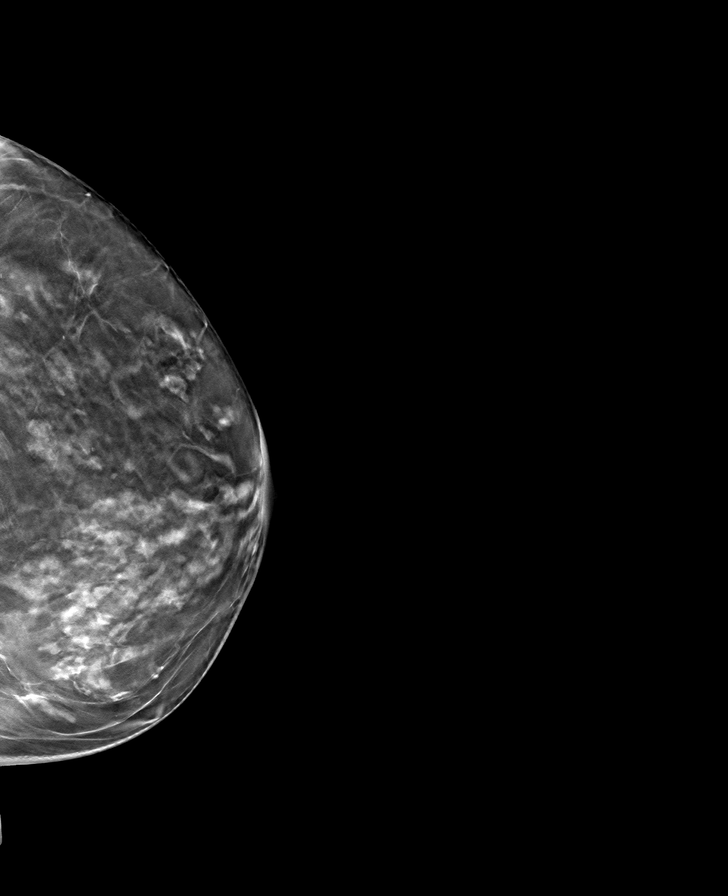

[L MLO tomo · tomo slice 35/69.0]
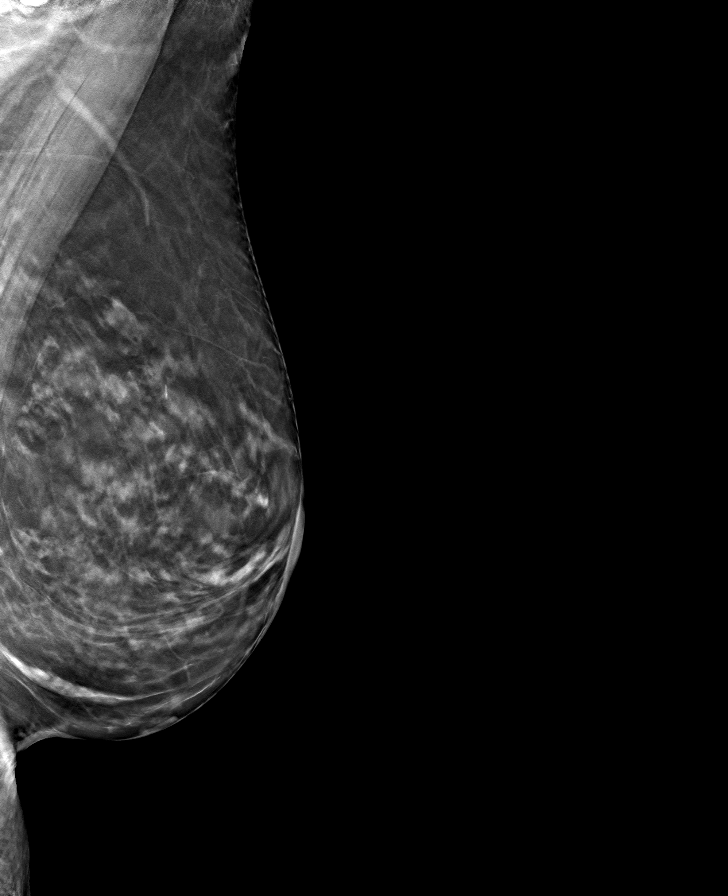

[8 of 24 positions shown; findings below may reference images not displayed]

ACR Breast Density Category c: The breast tissue is heterogeneously
dense, which may obscure small masses.
FINDINGS: There are no findings suspicious for malignancy. Images were
processed with CAD.
IMPRESSION: No mammographic evidence of malignancy. A result letter of this
screening mammogram will be mailed directly to the patient.

RECOMMENDATION:
Screening mammogram in one year. (Code:[id number]-[id number])

BI-RADS CATEGORY  1: Negative.

## 2020-05-18 ENCOUNTER — Telehealth: Payer: Self-pay | Admitting: Physician Assistant

## 2020-05-18 ENCOUNTER — Other Ambulatory Visit: Payer: Self-pay | Admitting: Physician Assistant

## 2020-05-18 DIAGNOSIS — E7841 Elevated Lipoprotein(a): Secondary | ICD-10-CM

## 2020-05-18 NOTE — Telephone Encounter (Signed)
Please call patient to schedule apt for further med refills.  ° °AS,CMA °

## 2020-06-13 ENCOUNTER — Other Ambulatory Visit: Payer: Self-pay | Admitting: Physician Assistant

## 2020-06-13 DIAGNOSIS — Z1231 Encounter for screening mammogram for malignant neoplasm of breast: Secondary | ICD-10-CM

## 2020-07-07 ENCOUNTER — Ambulatory Visit
Admission: RE | Admit: 2020-07-07 | Discharge: 2020-07-07 | Disposition: A | Discharge status: Home / Self Care | Payer: Medicare Other | Source: Ambulatory Visit | Attending: Physician Assistant | Admitting: Physician Assistant

## 2020-07-07 ENCOUNTER — Other Ambulatory Visit: Payer: Self-pay

## 2020-07-07 DIAGNOSIS — Z1231 Encounter for screening mammogram for malignant neoplasm of breast: Secondary | ICD-10-CM

## 2020-07-18 ENCOUNTER — Ambulatory Visit (INDEPENDENT_AMBULATORY_CARE_PROVIDER_SITE_OTHER): Payer: Medicare Other | Admitting: Physician Assistant

## 2020-07-18 ENCOUNTER — Other Ambulatory Visit: Payer: Self-pay

## 2020-07-18 ENCOUNTER — Encounter: Payer: Self-pay | Admitting: Physician Assistant

## 2020-07-18 VITALS — BP 138/76 | HR 67 | Ht 63.0 in | Wt 158.4 lb

## 2020-07-18 DIAGNOSIS — R7309 Other abnormal glucose: Secondary | ICD-10-CM | POA: Diagnosis not present

## 2020-07-18 DIAGNOSIS — R030 Elevated blood-pressure reading, without diagnosis of hypertension: Secondary | ICD-10-CM | POA: Diagnosis not present

## 2020-07-18 DIAGNOSIS — E559 Vitamin D deficiency, unspecified: Secondary | ICD-10-CM

## 2020-07-18 DIAGNOSIS — E785 Hyperlipidemia, unspecified: Secondary | ICD-10-CM | POA: Diagnosis not present

## 2020-07-18 DIAGNOSIS — E663 Overweight: Secondary | ICD-10-CM

## 2020-07-18 DIAGNOSIS — R5383 Other fatigue: Secondary | ICD-10-CM

## 2020-07-18 DIAGNOSIS — Z0000 Encounter for general adult medical examination without abnormal findings: Secondary | ICD-10-CM

## 2020-07-18 NOTE — Progress Notes (Signed)
Established Patient Office Visit  Subjective:  Patient ID: Joann Wall, female    DOB: 1946/04/30  Age: 74 y.o. MRN: [id number]  CC:  Chief Complaint  Patient presents with  . Hyperlipidemia    HPI Joann Wall presents for follow up on hyperlipidemia. Has no acute concerns today. Declined influenza vaccine today because she is having a dermatology procedure for sebaceous cyst removal soon and plants to obtain after her procedure.  HLD: Pt taking medication as directed without issues. Denies side effects including myalgias and arthralgias. Monitors saturated and trans fats, but since the pandemic has not been as active.   Elevated BP: Pt denies chest pain, palpitations, dizziness, shortness of breath or lower extremity swelling. Checks BP at home and readings are usually 120s/70-80 with highest reading 135/75. Reports good hydration and monitors sodium. States energy levels and stamina are not the same as before.   Past Medical History:  Diagnosis Date  . Contusion, R foot- sees dr Delilah Shan- [facility]    bruised right foot  . Elevated blood pressure (not hypertension) 04/09/2016  . Overweight (BMI 25.0-29.9) 04/09/2016  . S/P bilateral breast reduction- 2000    15 years ago  . Status post left knee replacement- 2004   . Vitamin D insufficiency 04/09/2016    Past Surgical History:  Procedure Laterality Date  . BREAST BIOPSY Right   . BREAST SURGERY Bilateral    reduction  . COLONOSCOPY    . REDUCTION MAMMAPLASTY Bilateral   . REPLACEMENT TOTAL KNEE Left     Family History  Problem Relation Age of Onset  . Stroke Mother   . COPD Father   . Obesity Sister   . Hypertension Sister   . Crohn's disease Brother   . Healthy Daughter   . Healthy Son   . Suicidality Brother     Social History   Socioeconomic History  . Marital status: Widowed    Spouse name: Not on file  . Number of children: Not on file  . Years of education: Not on file  .  Highest education level: Not on file  Occupational History  . Not on file  Tobacco Use  . Smoking status: Never Smoker  . Smokeless tobacco: Never Used  Substance and Sexual Activity  . Alcohol use: Yes    Alcohol/week: 5.0 standard drinks    Types: 5 Glasses of wine per week  . Drug use: No  . Sexual activity: Never  Other Topics Concern  . Not on file  Social History Narrative  . Not on file   Social Determinants of Health   Financial Resource Strain:   . Difficulty of Paying Living Expenses: Not on file  Food Insecurity:   . Worried About Charity fundraiser in the Last Year: Not on file  . Ran Out of Food in the Last Year: Not on file  Transportation Needs:   . Lack of Transportation (Medical): Not on file  . Lack of Transportation (Non-Medical): Not on file  Physical Activity:   . Days of Exercise per Week: Not on file  . Minutes of Exercise per Session: Not on file  Stress:   . Feeling of Stress : Not on file  Social Connections:   . Frequency of Communication with Friends and Family: Not on file  . Frequency of Social Gatherings with Friends and Family: Not on file  . Attends Religious Services: Not on file  . Active Member of Clubs or Organizations: Not on  file  . Attends Archivist Meetings: Not on file  . Marital Status: Not on file  Intimate Partner Violence:   . Fear of Current or Ex-Partner: Not on file  . Emotionally Abused: Not on file  . Physically Abused: Not on file  . Sexually Abused: Not on file    Outpatient Medications Prior to Visit  Medication Sig Dispense Refill  . BIOTIN PO Take 1 tablet by mouth daily.    . Calcium Carbonate (CALTRATE 600 PO) Take by mouth daily.    . Cholecalciferol (VITAMIN D3) 50 MCG (2000 UT) capsule Take 1 capsule (2,000 Units total) by mouth daily.    Marland Kitchen ibuprofen (ADVIL,MOTRIN) 400 MG tablet Take 1 tablet (400 mg total) by mouth every 6 (six) hours as needed for mild pain or moderate pain. 30 tablet 0  .  atorvastatin (LIPITOR) 10 MG tablet Take 1 tablet (10 mg total) by mouth at bedtime. **PATIENT NEEDS APT FOR FURTHER MED REFILLS** 60 tablet 0  . cyclobenzaprine (FLEXERIL) 10 MG tablet Take 1 tablet (10 mg total) by mouth 3 (three) times daily as needed for muscle spasms. (Patient not taking: Reported on 07/18/2020) 30 tablet 0  . predniSONE (DELTASONE) 20 MG tablet 2 pills a day for 3 days, 1 pill a day for 3 days then one half pill a day for 3 days then off (Patient not taking: Reported on 07/18/2020) 14 tablet 0   Facility-Administered Medications Prior to Visit  Medication Dose Route Frequency Provider Last Rate Last Admin  . 0.9 %  sodium chloride infusion  500 mL Intravenous Continuous Milus Banister, MD        Allergies  Allergen Reactions  . Sulfa Antibiotics Other (See Comments)    disoriented    ROS Review of Systems Review of Systems:  A fourteen system review of systems was performed and found to be positive as per HPI.  Objective:    Physical Exam General:  Well Developed, well nourished, appropriate for stated age.  Neuro:  Alert and oriented,  extra-ocular muscles intact, no focal deficits  HEENT:  Normocephalic, atraumatic, neck supple Skin:  no gross rash, warm, pink. Cardiac:  RRR, S1 S2 Respiratory:  ECTA B/L and A/P, Not using accessory muscles, speaking in full sentences- unlabored. Vascular:  Ext warm, no cyanosis apprec.; cap RF less 2 sec. Psych:  No HI/SI, judgement and insight good, Euthymic mood. Full Affect.  BP 138/76   Pulse 67   Ht 5' 3"  (1.6 m)   Wt 158 lb 6.4 oz (71.8 kg)   SpO2 99%   BMI 28.06 kg/m  Wt Readings from Last 3 Encounters:  07/18/20 158 lb 6.4 oz (71.8 kg)  04/27/19 150 lb (68 kg)  04/20/19 152 lb 4.8 oz (69.1 kg)     Health Maintenance Due  Topic Date Due  . Hepatitis C Screening  Never done  . COVID-19 Vaccine (1) Never done  . PNA vac Low Risk Adult (2 of 2 - PCV13) 03/02/2011  . INFLUENZA VACCINE  04/17/2020     There are no preventive care reminders to display for this patient.  Lab Results  Component Value Date   TSH 3.250 07/18/2020   Lab Results  Component Value Date   WBC 3.9 07/18/2020   HGB 13.0 07/18/2020   HCT 38.4 07/18/2020   MCV 100 (H) 07/18/2020   PLT 240 07/18/2020   Lab Results  Component Value Date   NA 140 07/18/2020   K 4.4  07/18/2020   CO2 27 07/18/2020   GLUCOSE 93 07/18/2020   BUN 6 (L) 07/18/2020   CREATININE 0.39 (L) 07/18/2020   BILITOT 0.5 07/18/2020   ALKPHOS 113 07/18/2020   AST 37 07/18/2020   ALT 30 07/18/2020   PROT 6.4 07/18/2020   ALBUMIN 4.1 07/18/2020   CALCIUM 9.0 07/18/2020   Lab Results  Component Value Date   CHOL 153 07/18/2020   Lab Results  Component Value Date   HDL 83 07/18/2020   Lab Results  Component Value Date   LDLCALC 57 07/18/2020   Lab Results  Component Value Date   TRIG 63 07/18/2020   Lab Results  Component Value Date   CHOLHDL 1.8 07/18/2020   Lab Results  Component Value Date   HGBA1C 5.3 07/18/2020      Assessment & Plan:   Problem List Items Addressed This Visit      Other   Overweight (BMI 25.0-29.9) (Chronic)    -Encourage to increase physical activity and continue with a heart healthy diet.       Relevant Orders   Comp Met (CMET) (Completed)   CBC w/Diff (Completed)   TSH (Completed)   HgB A1c (Completed)   Lipid Profile (Completed)   Vitamin D insufficiency (Chronic)    -Last Vitamin D 44.9 -Continue Vitamin D3 supplement. -Will repeat Vitamin D today.      Relevant Orders   Vitamin D (25 hydroxy) (Completed)   Elevated blood pressure reading without diagnosis of hypertension    -BP elevated, BP recheck improved and stable. -Continue ambulatory BP monitoring. -Continue low sodium diet and good hydration. -Will continue to monitor.      Relevant Orders   Comp Met (CMET) (Completed)   TSH (Completed)   HgB A1c (Completed)   Hyperlipidemia - Primary    -Last lipid  panel wnl -Continue current medication regimen. -Follow a heart healthy diet. -Stay as active as possible. -Will repeat lipid panel and hepatic function today.      Relevant Medications   atorvastatin (LIPITOR) 10 MG tablet   Other Relevant Orders   Comp Met (CMET) (Completed)   Lipid Profile (Completed)   Elevated glucose   Relevant Orders   HgB A1c (Completed)   Fatigue   Relevant Orders   TSH (Completed)    Other Visit Diagnoses    Healthcare maintenance       Relevant Orders   Comp Met (CMET) (Completed)   CBC w/Diff (Completed)   TSH (Completed)   HgB A1c (Completed)   Lipid Profile (Completed)     Healthcare Maintenance:  -Patient is fasting so will collect FBW and advised to schedule MCW. -Will get influenza vaccine in the near future.  Meds ordered this encounter  Medications  . atorvastatin (LIPITOR) 10 MG tablet    Sig: Take 1 tablet (10 mg total) by mouth at bedtime.    Dispense:  90 tablet    Refill:  1    Order Specific Question:   Supervising Provider    Answer:   Beatrice Lecher D [[id number]]    Follow-up: Return for MCW in  2-4 months.   Note:  This note was prepared with assistance of Dragon voice recognition software. Occasional wrong-word or sound-a-like substitutions may have occurred due to the inherent limitations of voice recognition software.  Lorrene Reid, PA-C

## 2020-07-18 NOTE — Assessment & Plan Note (Addendum)
-  BP elevated, BP recheck improved and stable. -Continue ambulatory BP monitoring. -Continue low sodium diet and good hydration. -Will continue to monitor.

## 2020-07-18 NOTE — Assessment & Plan Note (Signed)
-  Encourage to increase physical activity and continue with a heart healthy diet.

## 2020-07-18 NOTE — Assessment & Plan Note (Signed)
-  Last Vitamin D 44.9 -Continue Vitamin D3 supplement. -Will repeat Vitamin D today.

## 2020-07-18 NOTE — Patient Instructions (Addendum)

## 2020-07-18 NOTE — Assessment & Plan Note (Signed)
-  Last lipid panel wnl -Continue current medication regimen. -Follow a heart healthy diet. -Stay as active as possible. -Will repeat lipid panel and hepatic function today.

## 2020-07-19 DIAGNOSIS — L7211 Pilar cyst: Secondary | ICD-10-CM | POA: Diagnosis not present

## 2020-07-19 DIAGNOSIS — L989 Disorder of the skin and subcutaneous tissue, unspecified: Secondary | ICD-10-CM | POA: Diagnosis not present

## 2020-07-19 LAB — CBC WITH DIFFERENTIAL/PLATELET
Basophils Absolute: 0 10*3/uL (ref 0.0–0.2)
Basos: 1 %
EOS (ABSOLUTE): 0.1 10*3/uL (ref 0.0–0.4)
Eos: 3 %
Hematocrit: 38.4 % (ref 34.0–46.6)
Hemoglobin: 13 g/dL (ref 11.1–15.9)
Immature Grans (Abs): 0 10*3/uL (ref 0.0–0.1)
Immature Granulocytes: 0 %
Lymphocytes Absolute: 1.4 10*3/uL (ref 0.7–3.1)
Lymphs: 36 %
MCH: 33.9 pg — ABNORMAL HIGH (ref 26.6–33.0)
MCHC: 33.9 g/dL (ref 31.5–35.7)
MCV: 100 fL — ABNORMAL HIGH (ref 79–97)
Monocytes Absolute: 0.4 10*3/uL (ref 0.1–0.9)
Monocytes: 11 %
Neutrophils Absolute: 1.9 10*3/uL (ref 1.4–7.0)
Neutrophils: 49 %
Platelets: 240 10*3/uL (ref 150–450)
RBC: 3.83 x10E6/uL (ref 3.77–5.28)
RDW: 12.5 % (ref 11.7–15.4)
WBC: 3.9 10*3/uL (ref 3.4–10.8)

## 2020-07-19 LAB — COMPREHENSIVE METABOLIC PANEL
ALT: 30 IU/L (ref 0–32)
AST: 37 IU/L (ref 0–40)
Albumin/Globulin Ratio: 1.8 (ref 1.2–2.2)
Albumin: 4.1 g/dL (ref 3.7–4.7)
Alkaline Phosphatase: 113 IU/L (ref 44–121)
BUN/Creatinine Ratio: 15 (ref 12–28)
BUN: 6 mg/dL — ABNORMAL LOW (ref 8–27)
Bilirubin Total: 0.5 mg/dL (ref 0.0–1.2)
CO2: 27 mmol/L (ref 20–29)
Calcium: 9 mg/dL (ref 8.7–10.3)
Chloride: 102 mmol/L (ref 96–106)
Creatinine, Ser: 0.39 mg/dL — ABNORMAL LOW (ref 0.57–1.00)
GFR calc Af Amer: 119 mL/min/{1.73_m2} (ref >59)
GFR calc non Af Amer: 103 mL/min/{1.73_m2} (ref >59)
Globulin, Total: 2.3 g/dL (ref 1.5–4.5)
Glucose: 93 mg/dL (ref 65–99)
Potassium: 4.4 mmol/L (ref 3.5–5.2)
Sodium: 140 mmol/L (ref 134–144)
Total Protein: 6.4 g/dL (ref 6.0–8.5)

## 2020-07-19 LAB — LIPID PANEL
Chol/HDL Ratio: 1.8 ratio (ref 0.0–4.4)
Cholesterol, Total: 153 mg/dL (ref 100–199)
HDL: 83 mg/dL (ref >39)
LDL Chol Calc (NIH): 57 mg/dL (ref 0–99)
Triglycerides: 63 mg/dL (ref 0–149)
VLDL Cholesterol Cal: 13 mg/dL (ref 5–40)

## 2020-07-19 LAB — HEMOGLOBIN A1C
Est. average glucose Bld gHb Est-mCnc: 105 mg/dL
Hgb A1c MFr Bld: 5.3 % (ref 4.8–5.6)

## 2020-07-19 LAB — TSH: TSH: 3.25 u[IU]/mL (ref 0.450–4.500)

## 2020-07-19 LAB — VITAMIN D 25 HYDROXY (VIT D DEFICIENCY, FRACTURES): Vit D, 25-Hydroxy: 36.8 ng/mL (ref 30.0–100.0)

## 2020-07-19 MED ORDER — ATORVASTATIN CALCIUM 10 MG PO TABS: 10.0000 mg | ORAL_TABLET | Freq: Every day | ORAL | 1 refills | Status: AC

## 2020-11-25 DIAGNOSIS — H26491 Other secondary cataract, right eye: Secondary | ICD-10-CM | POA: Diagnosis not present

## 2020-11-25 DIAGNOSIS — H35373 Puckering of macula, bilateral: Secondary | ICD-10-CM | POA: Diagnosis not present

## 2020-11-25 DIAGNOSIS — H04123 Dry eye syndrome of bilateral lacrimal glands: Secondary | ICD-10-CM | POA: Diagnosis not present

## 2020-11-25 DIAGNOSIS — Z9889 Other specified postprocedural states: Secondary | ICD-10-CM | POA: Diagnosis not present

## 2020-11-25 DIAGNOSIS — Z961 Presence of intraocular lens: Secondary | ICD-10-CM | POA: Diagnosis not present

## 2020-11-25 DIAGNOSIS — H43813 Vitreous degeneration, bilateral: Secondary | ICD-10-CM | POA: Diagnosis not present

## 2021-03-07 DIAGNOSIS — E7800 Pure hypercholesterolemia, unspecified: Secondary | ICD-10-CM | POA: Diagnosis not present

## 2021-03-07 DIAGNOSIS — Z5181 Encounter for therapeutic drug level monitoring: Secondary | ICD-10-CM | POA: Diagnosis not present

## 2021-03-07 DIAGNOSIS — D7589 Other specified diseases of blood and blood-forming organs: Secondary | ICD-10-CM | POA: Diagnosis not present

## 2021-03-07 DIAGNOSIS — H9313 Tinnitus, bilateral: Secondary | ICD-10-CM | POA: Diagnosis not present

## 2021-03-20 IMAGING — MG DIGITAL SCREENING BILAT W/ TOMO W/ CAD
6 of 10 series · 6 of 30 positions shown · non-contrast
Comparison: Previous exam(s).

CLINICAL DATA: Screening.

EXAM:
DIGITAL SCREENING BILATERAL MAMMOGRAM WITH TOMO AND CAD

[L CC synth-2D]
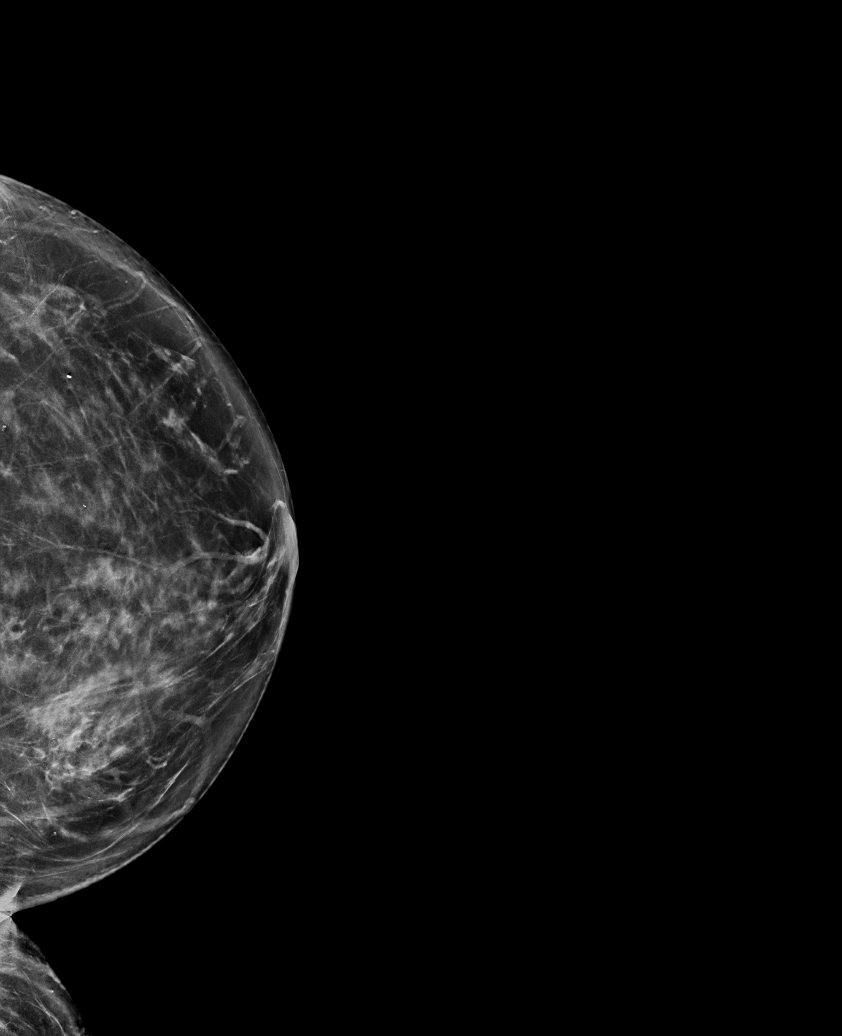

[R CC synth-2D]
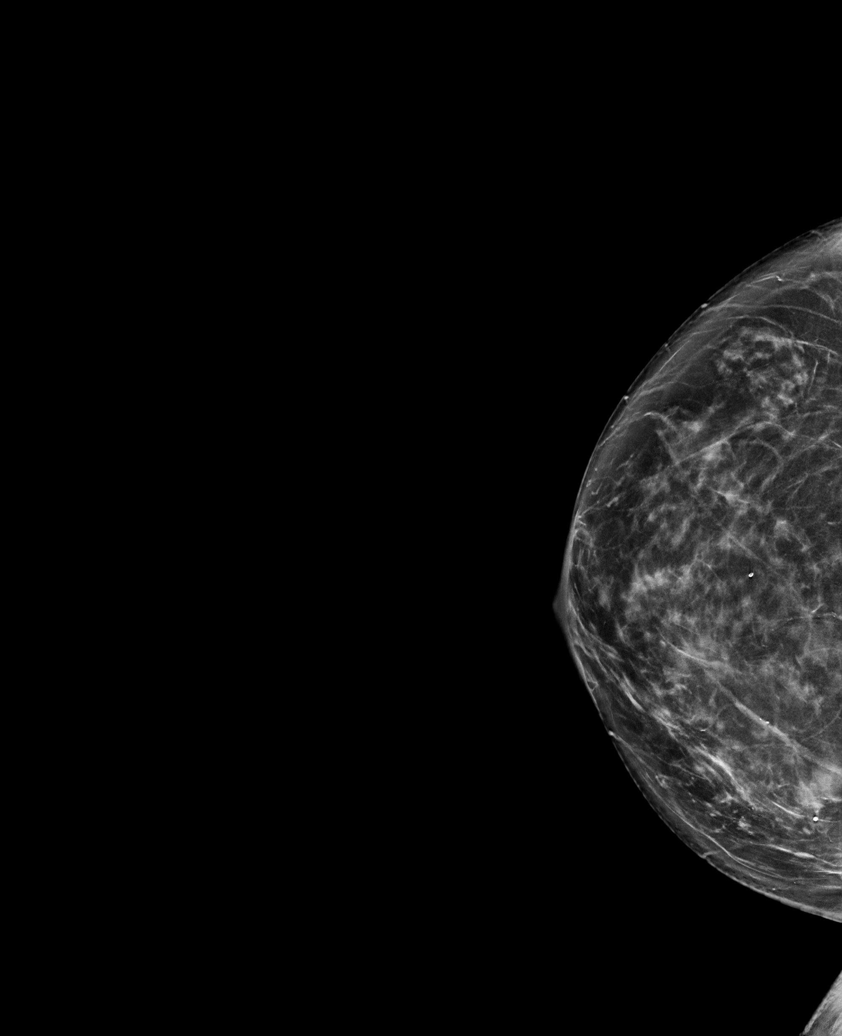

[L MLO synth-2D (1 of 2)]
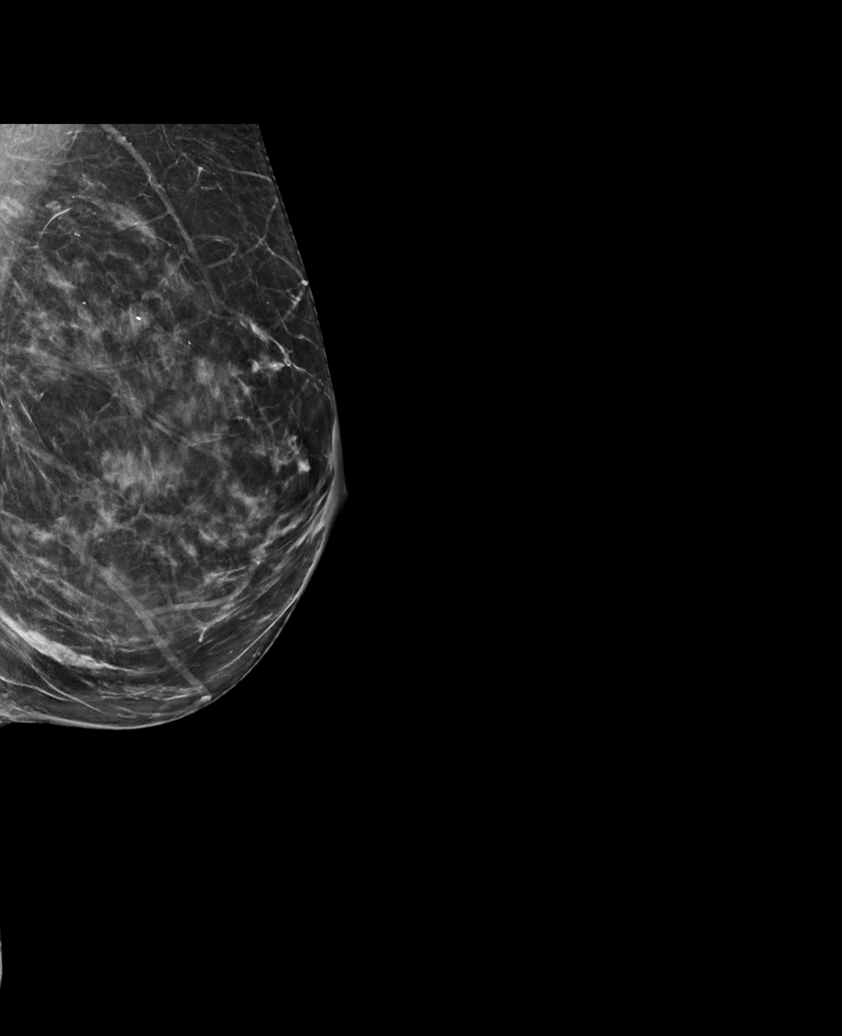

[L MLO synth-2D (2 of 2)]
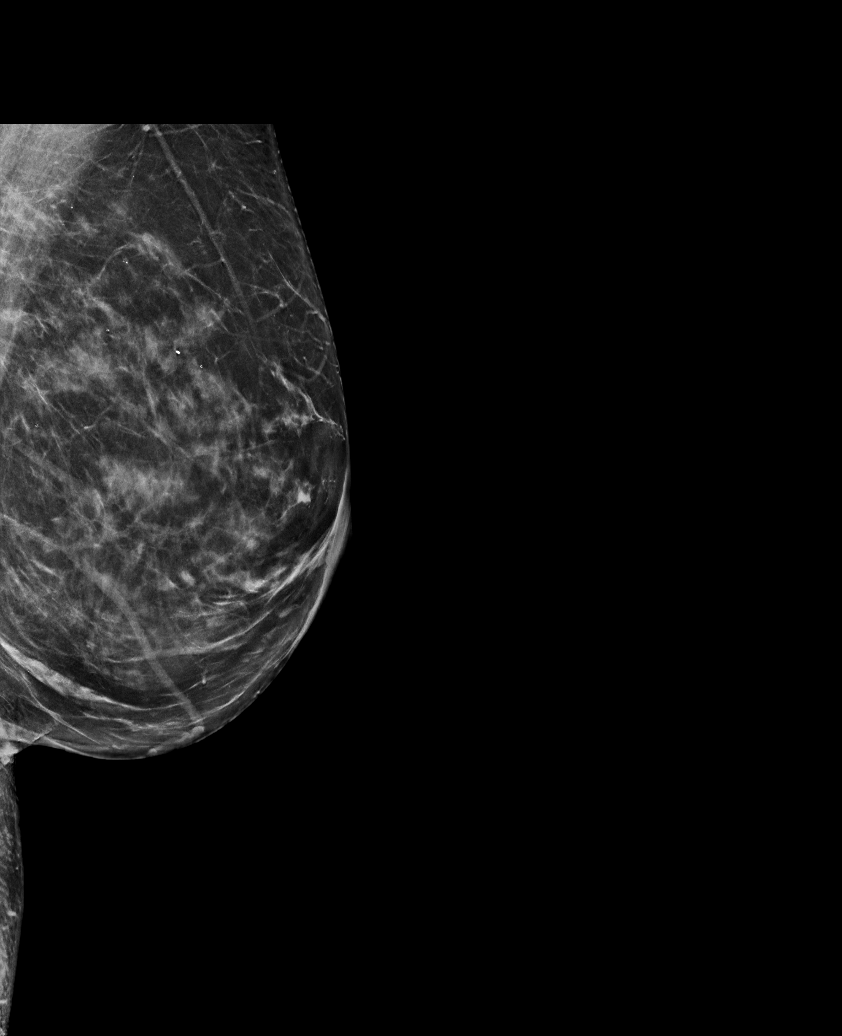

[R MLO synth-2D]
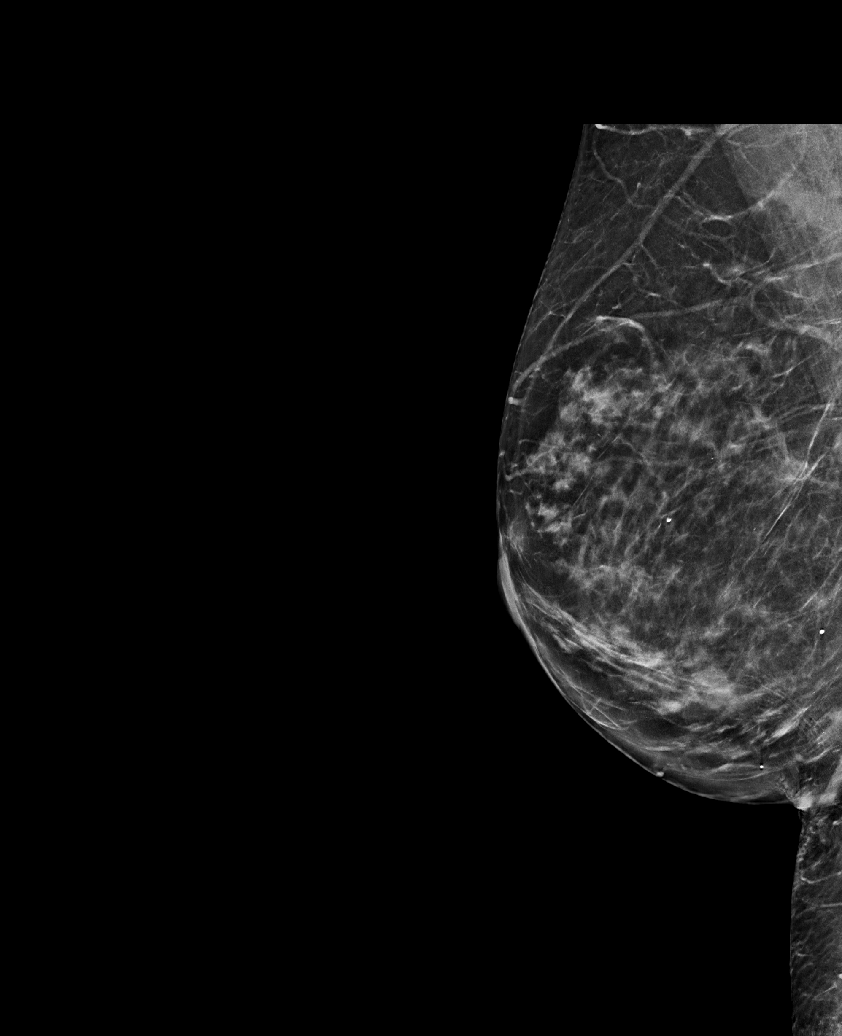

[L CC tomo · tomo slice 39/76.0]
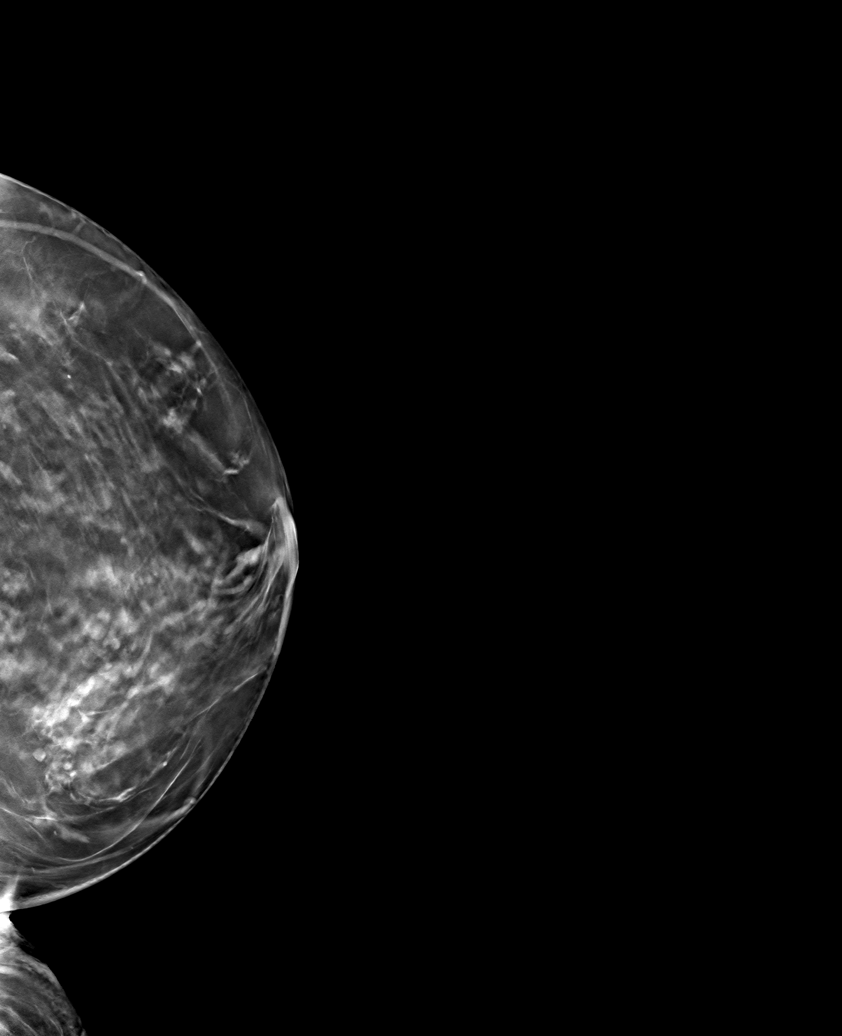

[6 of 30 positions shown; findings below may reference images not displayed]

ACR Breast Density Category c: The breast tissue is heterogeneously
dense, which may obscure small masses.
FINDINGS: There are no findings suspicious for malignancy. Images were
processed with CAD.
IMPRESSION: No mammographic evidence of malignancy. A result letter of this
screening mammogram will be mailed directly to the patient.

RECOMMENDATION:
Screening mammogram in one year. (Code:[id number])

BI-RADS CATEGORY  1: Negative.

## 2021-04-26 DIAGNOSIS — D2272 Melanocytic nevi of left lower limb, including hip: Secondary | ICD-10-CM | POA: Diagnosis not present

## 2021-04-26 DIAGNOSIS — L559 Sunburn, unspecified: Secondary | ICD-10-CM | POA: Diagnosis not present

## 2021-04-26 DIAGNOSIS — D225 Melanocytic nevi of trunk: Secondary | ICD-10-CM | POA: Diagnosis not present

## 2021-04-26 DIAGNOSIS — L821 Other seborrheic keratosis: Secondary | ICD-10-CM | POA: Diagnosis not present

## 2021-04-26 DIAGNOSIS — Z85828 Personal history of other malignant neoplasm of skin: Secondary | ICD-10-CM | POA: Diagnosis not present

## 2021-04-26 DIAGNOSIS — L578 Other skin changes due to chronic exposure to nonionizing radiation: Secondary | ICD-10-CM | POA: Diagnosis not present

## 2021-04-26 DIAGNOSIS — D485 Neoplasm of uncertain behavior of skin: Secondary | ICD-10-CM | POA: Diagnosis not present

## 2021-04-26 DIAGNOSIS — D2262 Melanocytic nevi of left upper limb, including shoulder: Secondary | ICD-10-CM | POA: Diagnosis not present

## 2021-04-26 DIAGNOSIS — L570 Actinic keratosis: Secondary | ICD-10-CM | POA: Diagnosis not present

## 2021-05-27 ENCOUNTER — Telehealth: Payer: Self-pay

## 2021-05-27 NOTE — Telephone Encounter (Signed)
LM to advise pt to schedule AWV with either myself or PCP. I will be available 06/03/21, 06/10/21 and 06/11/21 to schedule with for medicare wellness visits. Pt was also left office contact info to call.    Blima Singer RMA

## 2021-05-31 ENCOUNTER — Other Ambulatory Visit: Payer: Self-pay | Admitting: Family Medicine

## 2021-05-31 DIAGNOSIS — Z1231 Encounter for screening mammogram for malignant neoplasm of breast: Secondary | ICD-10-CM

## 2021-06-09 DIAGNOSIS — Z23 Encounter for immunization: Secondary | ICD-10-CM | POA: Diagnosis not present

## 2021-06-13 ENCOUNTER — Telehealth: Payer: Self-pay | Admitting: Physician Assistant

## 2021-06-13 NOTE — Telephone Encounter (Signed)
Left message for patient to call back and schedule Medicare Annual Wellness Visit (AWV) in office.   If not able to come in office, please offer to do virtually or by telephone.  Left office number and my jabber [phone].  Last AWV:04/20/2019  Please schedule at anytime with Nurse Health Advisor.

## 2021-06-19 ENCOUNTER — Encounter: Payer: Self-pay | Admitting: Gastroenterology

## 2021-07-18 ENCOUNTER — Ambulatory Visit
Admission: RE | Admit: 2021-07-18 | Discharge: 2021-07-18 | Disposition: A | Discharge status: Home / Self Care | Payer: Medicare Other | Source: Ambulatory Visit | Attending: Family Medicine | Admitting: Family Medicine

## 2021-07-18 DIAGNOSIS — Z1231 Encounter for screening mammogram for malignant neoplasm of breast: Secondary | ICD-10-CM

## 2021-07-20 ENCOUNTER — Other Ambulatory Visit: Payer: Self-pay | Admitting: Family Medicine

## 2021-07-20 DIAGNOSIS — R928 Other abnormal and inconclusive findings on diagnostic imaging of breast: Secondary | ICD-10-CM

## 2021-08-15 ENCOUNTER — Ambulatory Visit
Admission: RE | Admit: 2021-08-15 | Discharge: 2021-08-15 | Disposition: A | Discharge status: Home / Self Care | Payer: Medicare Other | Source: Ambulatory Visit | Attending: Family Medicine | Admitting: Family Medicine

## 2021-08-15 ENCOUNTER — Ambulatory Visit: Payer: Medicare Other

## 2021-08-15 DIAGNOSIS — R928 Other abnormal and inconclusive findings on diagnostic imaging of breast: Secondary | ICD-10-CM

## 2021-08-15 DIAGNOSIS — R922 Inconclusive mammogram: Secondary | ICD-10-CM | POA: Diagnosis not present

## 2021-09-21 DIAGNOSIS — Z23 Encounter for immunization: Secondary | ICD-10-CM | POA: Diagnosis not present

## 2021-10-12 DIAGNOSIS — D7589 Other specified diseases of blood and blood-forming organs: Secondary | ICD-10-CM | POA: Diagnosis not present

## 2021-10-12 DIAGNOSIS — Z23 Encounter for immunization: Secondary | ICD-10-CM | POA: Diagnosis not present

## 2021-10-12 DIAGNOSIS — R030 Elevated blood-pressure reading, without diagnosis of hypertension: Secondary | ICD-10-CM | POA: Diagnosis not present

## 2021-10-12 DIAGNOSIS — H9313 Tinnitus, bilateral: Secondary | ICD-10-CM | POA: Diagnosis not present

## 2021-10-12 DIAGNOSIS — Z0000 Encounter for general adult medical examination without abnormal findings: Secondary | ICD-10-CM | POA: Diagnosis not present

## 2021-10-12 DIAGNOSIS — E7800 Pure hypercholesterolemia, unspecified: Secondary | ICD-10-CM | POA: Diagnosis not present

## 2021-10-12 DIAGNOSIS — E538 Deficiency of other specified B group vitamins: Secondary | ICD-10-CM | POA: Diagnosis not present

## 2021-10-25 ENCOUNTER — Other Ambulatory Visit: Payer: Self-pay | Admitting: Family Medicine

## 2021-10-25 DIAGNOSIS — E2839 Other primary ovarian failure: Secondary | ICD-10-CM

## 2021-11-17 DIAGNOSIS — I10 Essential (primary) hypertension: Secondary | ICD-10-CM | POA: Diagnosis not present

## 2021-11-28 DIAGNOSIS — H43813 Vitreous degeneration, bilateral: Secondary | ICD-10-CM | POA: Diagnosis not present

## 2021-11-28 DIAGNOSIS — H35373 Puckering of macula, bilateral: Secondary | ICD-10-CM | POA: Diagnosis not present

## 2021-11-28 DIAGNOSIS — H26492 Other secondary cataract, left eye: Secondary | ICD-10-CM | POA: Diagnosis not present

## 2021-11-28 DIAGNOSIS — H04123 Dry eye syndrome of bilateral lacrimal glands: Secondary | ICD-10-CM | POA: Diagnosis not present

## 2021-11-28 DIAGNOSIS — Z9889 Other specified postprocedural states: Secondary | ICD-10-CM | POA: Diagnosis not present

## 2021-11-28 DIAGNOSIS — Z961 Presence of intraocular lens: Secondary | ICD-10-CM | POA: Diagnosis not present

## 2022-01-29 ENCOUNTER — Ambulatory Visit
Admission: RE | Admit: 2022-01-29 | Discharge: 2022-01-29 | Disposition: A | Discharge status: Home / Self Care | Payer: Medicare Other | Source: Ambulatory Visit | Attending: Family Medicine | Admitting: Family Medicine

## 2022-01-29 DIAGNOSIS — Z780 Asymptomatic menopausal state: Secondary | ICD-10-CM | POA: Diagnosis not present

## 2022-01-29 DIAGNOSIS — E2839 Other primary ovarian failure: Secondary | ICD-10-CM

## 2022-01-29 DIAGNOSIS — M8589 Other specified disorders of bone density and structure, multiple sites: Secondary | ICD-10-CM | POA: Diagnosis not present

## 2022-03-30 ENCOUNTER — Other Ambulatory Visit (HOSPITAL_BASED_OUTPATIENT_CLINIC_OR_DEPARTMENT_OTHER): Payer: Self-pay | Admitting: Family Medicine

## 2022-03-30 ENCOUNTER — Other Ambulatory Visit (HOSPITAL_BASED_OUTPATIENT_CLINIC_OR_DEPARTMENT_OTHER): Payer: Medicare Other

## 2022-03-30 DIAGNOSIS — R600 Localized edema: Secondary | ICD-10-CM | POA: Diagnosis not present

## 2022-03-30 DIAGNOSIS — I824Y1 Acute embolism and thrombosis of unspecified deep veins of right proximal lower extremity: Secondary | ICD-10-CM

## 2022-03-30 DIAGNOSIS — M79604 Pain in right leg: Secondary | ICD-10-CM | POA: Diagnosis not present

## 2022-03-30 DIAGNOSIS — D689 Coagulation defect, unspecified: Secondary | ICD-10-CM | POA: Diagnosis not present

## 2022-03-30 DIAGNOSIS — R5383 Other fatigue: Secondary | ICD-10-CM | POA: Diagnosis not present

## 2022-03-30 DIAGNOSIS — R55 Syncope and collapse: Secondary | ICD-10-CM | POA: Diagnosis not present

## 2022-03-31 IMAGING — MG MM DIGITAL SCREENING BILAT W/ TOMO AND CAD
6 of 10 series · 6 of 30 positions shown · non-contrast
Comparison: Previous exams.

CLINICAL DATA: Screening.

EXAM:
DIGITAL SCREENING BILATERAL MAMMOGRAM WITH TOMOSYNTHESIS AND CAD
TECHNIQUE: Bilateral screening digital craniocaudal and mediolateral oblique
mammograms were obtained. Bilateral screening digital breast
tomosynthesis was performed. The images were evaluated with
computer-aided detection.

[R CC synth-2D]
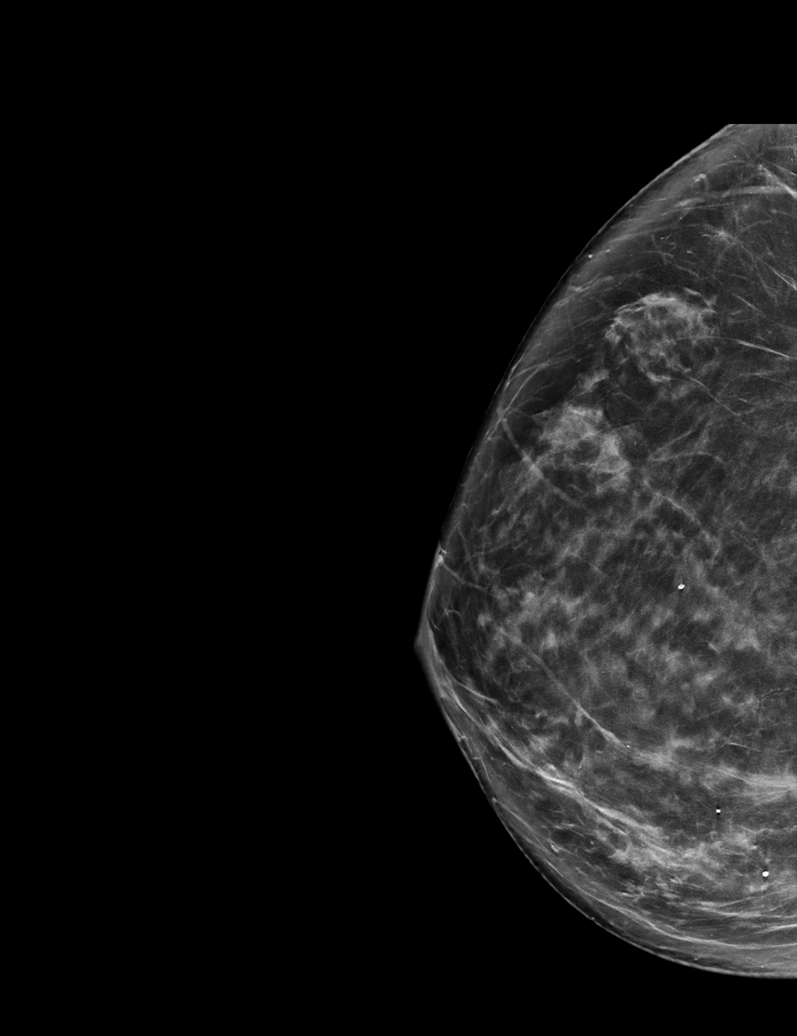

[L MLO synth-2D]
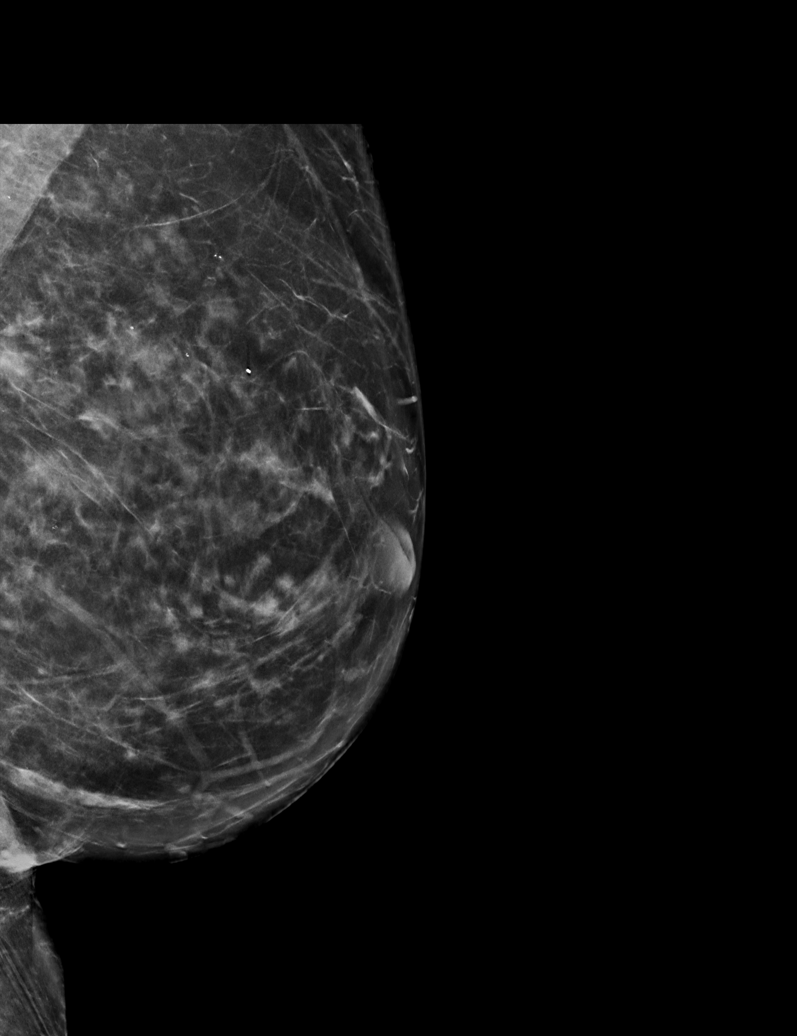

[L CC synth-2D (1 of 2)]
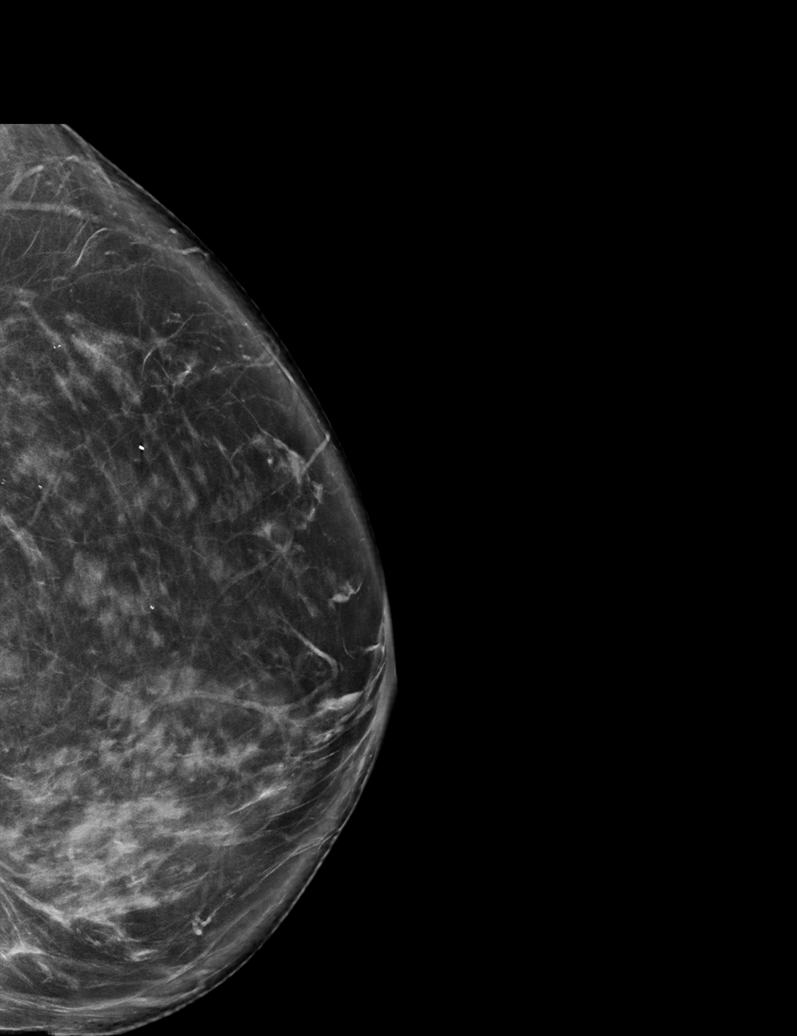

[R MLO synth-2D]
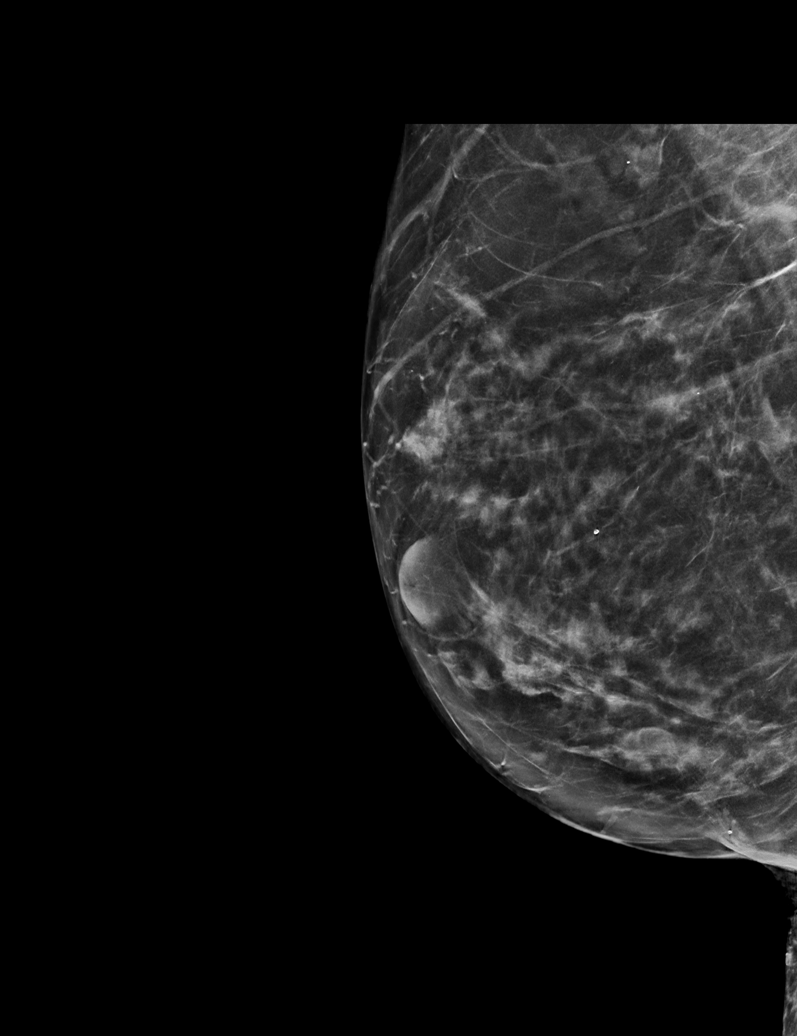

[L CC synth-2D (2 of 2)]
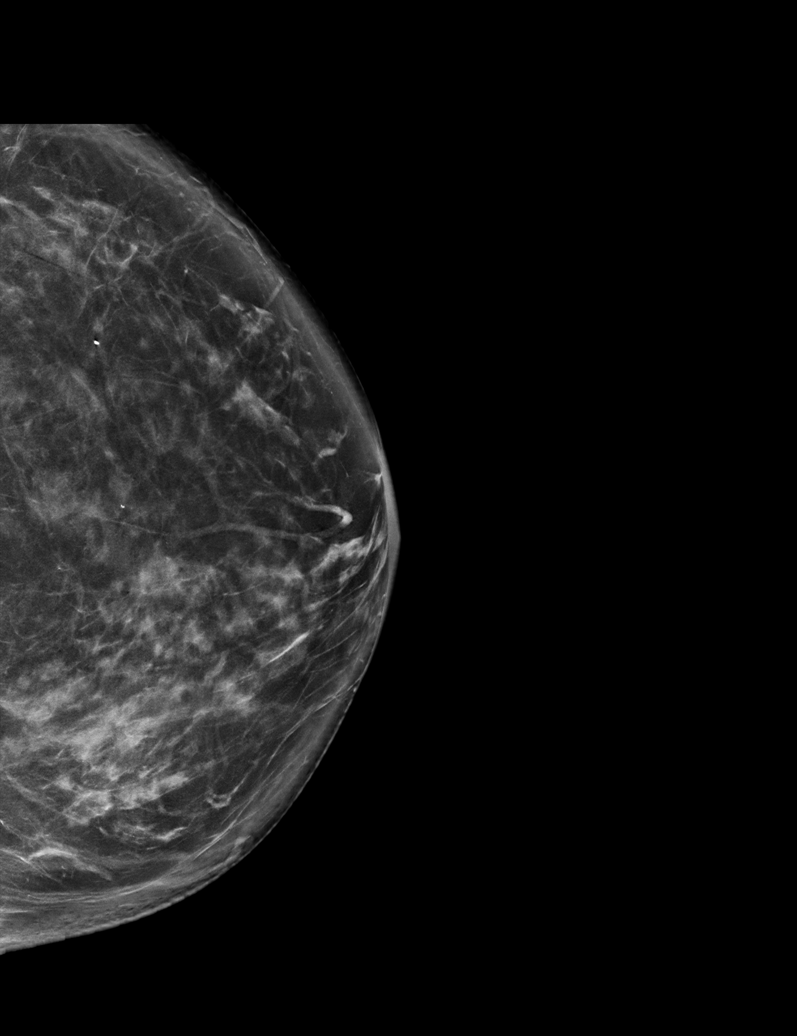

[R CC tomo · tomo slice 37/74.0]
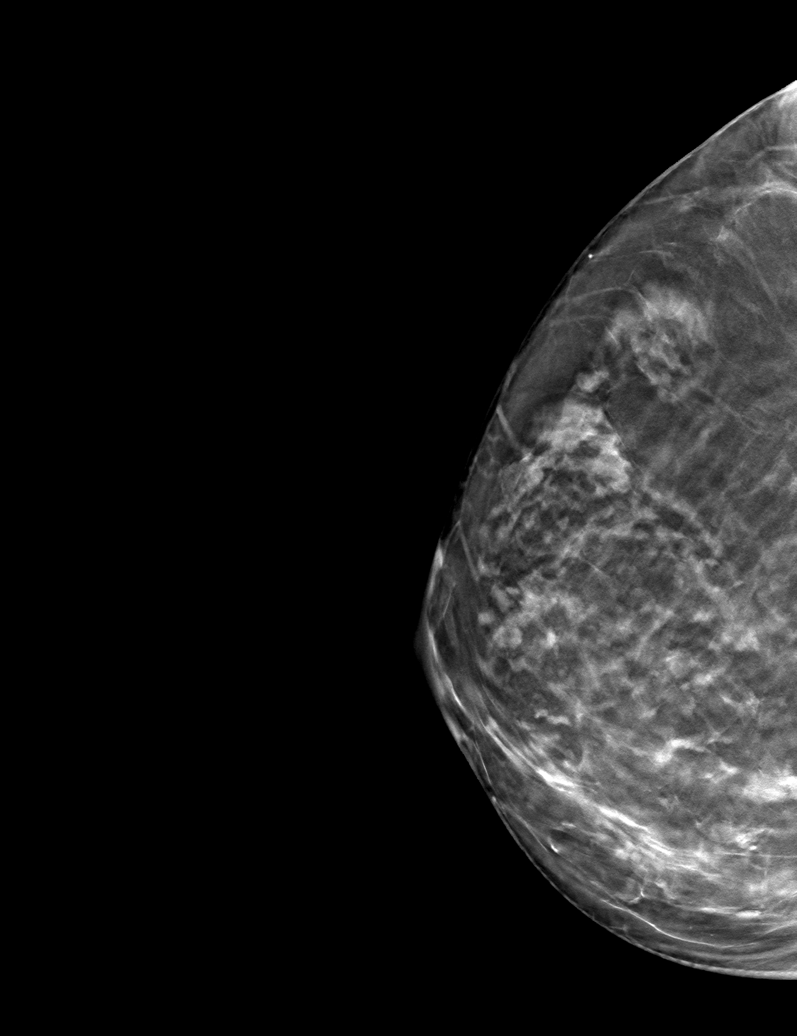

[6 of 30 positions shown; findings below may reference images not displayed]

ACR Breast Density Category c: The breast tissue is heterogeneously
dense, which may obscure small masses.
FINDINGS: In the right breast, a possible asymmetry warrants further
evaluation. In the left breast, no findings suspicious for
malignancy.
IMPRESSION: Further evaluation is suggested for possible asymmetry in the right
breast.

RECOMMENDATION:
Diagnostic mammogram and possibly ultrasound of the right breast.
(Code:[id number])

The patient will be contacted regarding the findings, and additional
imaging will be scheduled.

BI-RADS CATEGORY  0: Incomplete. Need additional imaging evaluation
and/or prior mammograms for comparison.

## 2022-04-02 ENCOUNTER — Ambulatory Visit (HOSPITAL_BASED_OUTPATIENT_CLINIC_OR_DEPARTMENT_OTHER)
Admission: RE | Admit: 2022-04-02 | Discharge: 2022-04-02 | Disposition: A | Discharge status: Home / Self Care | Payer: Medicare Other | Source: Ambulatory Visit | Attending: Family Medicine | Admitting: Family Medicine

## 2022-04-02 DIAGNOSIS — M7121 Synovial cyst of popliteal space [Baker], right knee: Secondary | ICD-10-CM | POA: Diagnosis not present

## 2022-04-02 DIAGNOSIS — I824Y1 Acute embolism and thrombosis of unspecified deep veins of right proximal lower extremity: Secondary | ICD-10-CM | POA: Insufficient documentation

## 2022-04-28 IMAGING — MG MM DIGITAL DIAGNOSTIC UNILAT*R* W/ TOMO W/ CAD
4 series · 4 of 12 positions shown · non-contrast
Comparison: Previous exams including recent screening mammogram
dated 07/18/2021.

CLINICAL DATA: Patient returns today to evaluate a possible RIGHT
breast asymmetry questioned on recent screening mammogram.

EXAM:
DIGITAL DIAGNOSTIC UNILATERAL RIGHT MAMMOGRAM WITH TOMOSYNTHESIS AND
CAD
TECHNIQUE: Right digital diagnostic mammography and breast tomosynthesis was
performed. The images were evaluated with computer-aided detection.

[R MLO synth-2D]
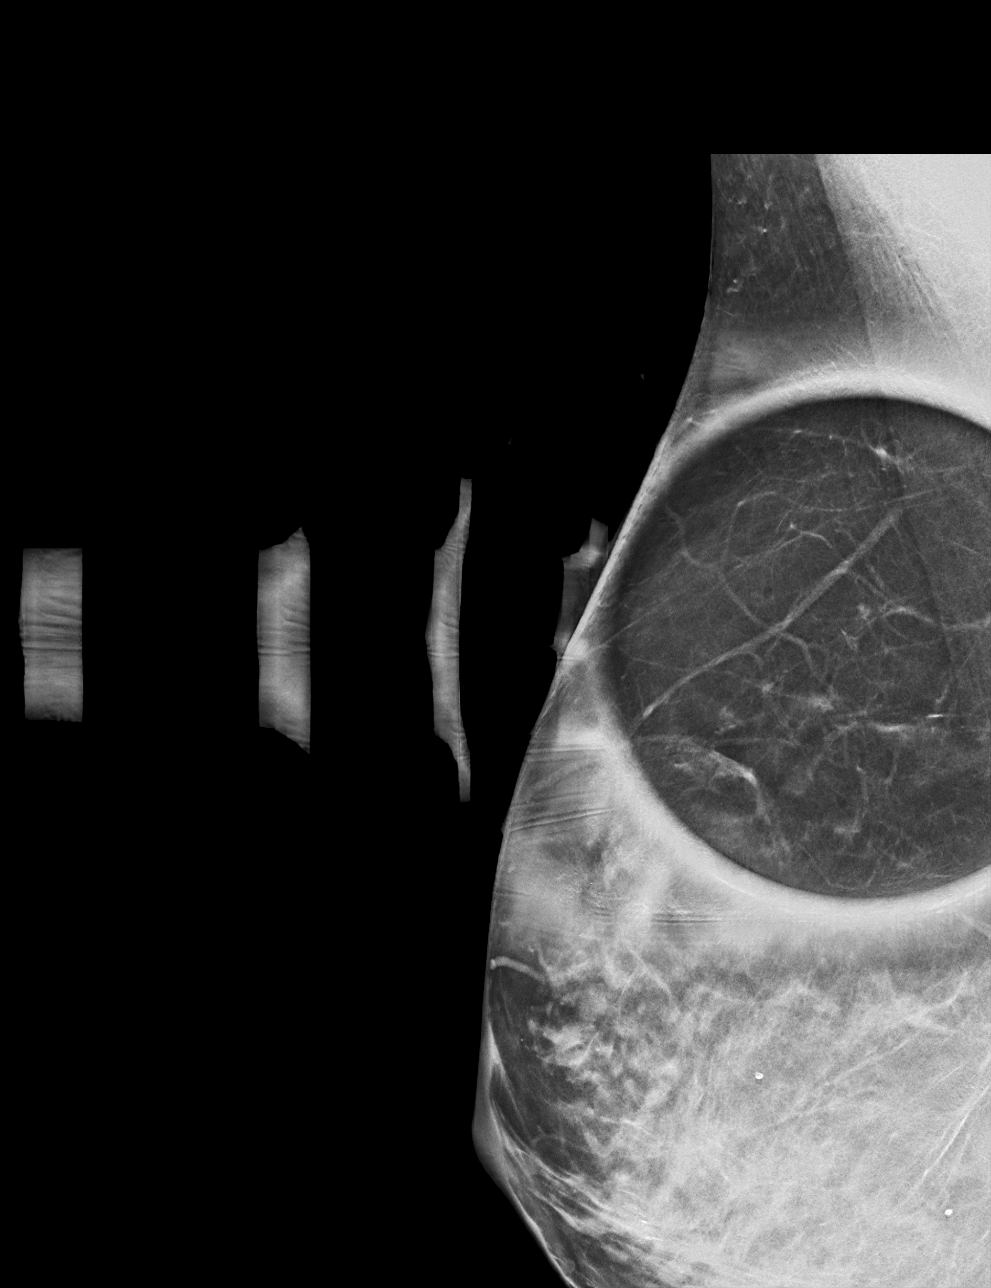

[R ML synth-2D]
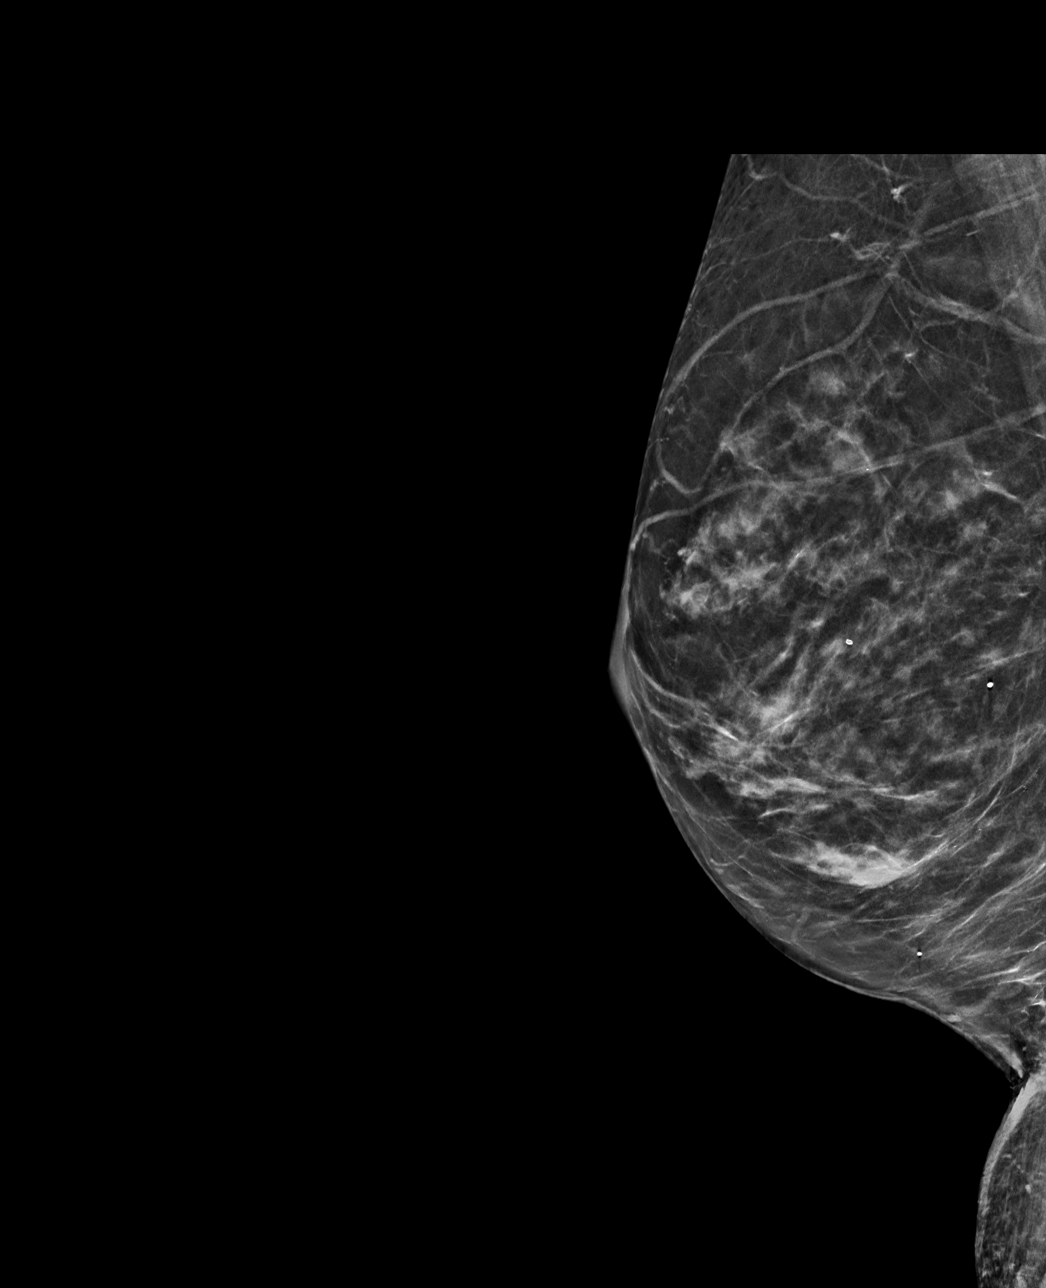

[R ML tomo · tomo slice 31/61.0]
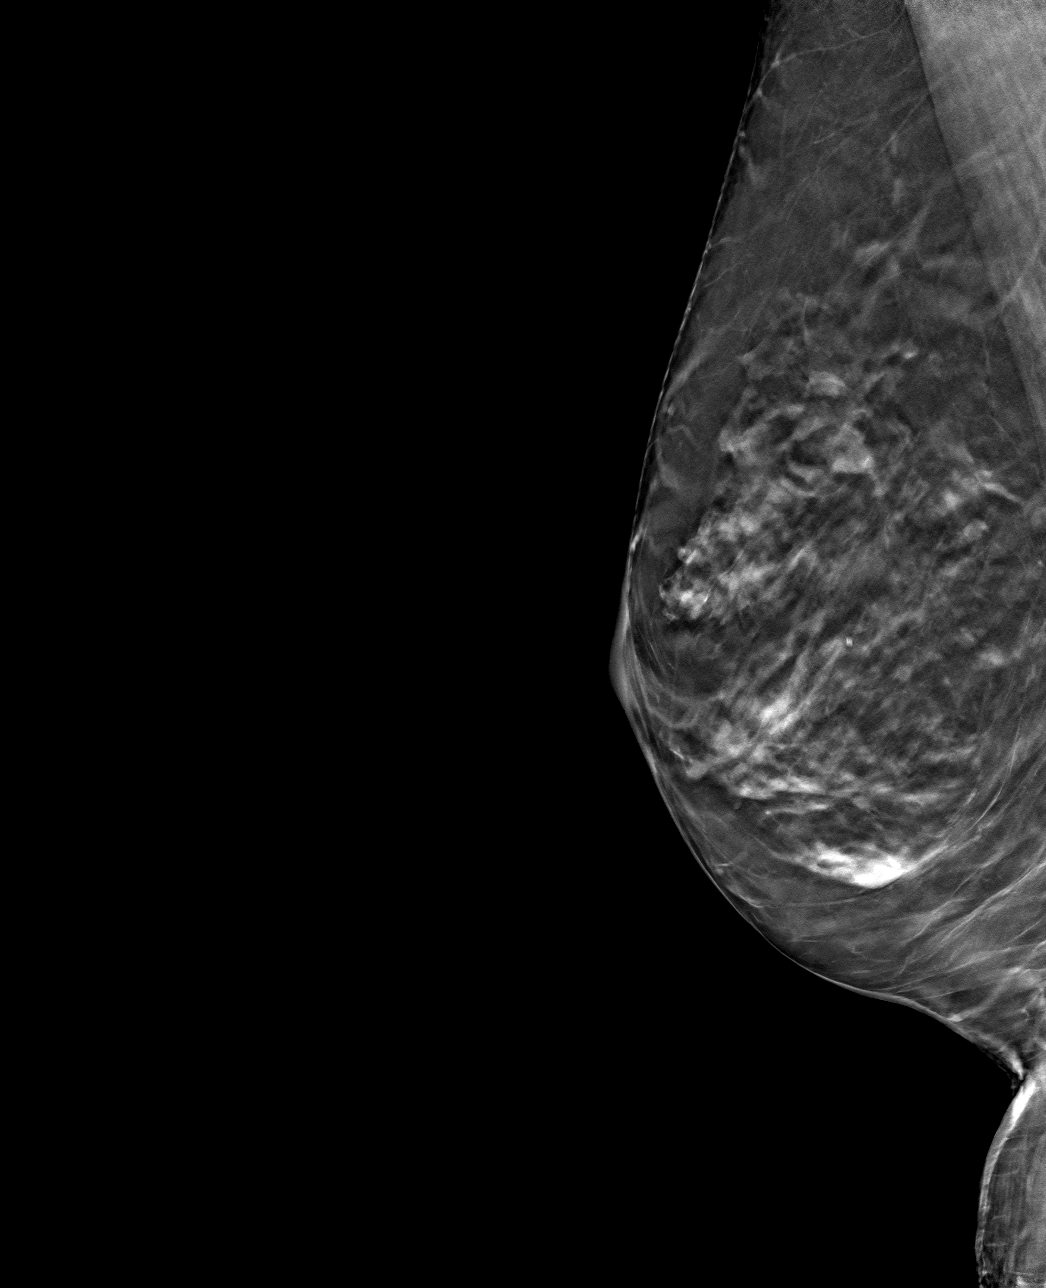

[R MLO tomo · tomo slice 28/55.0]
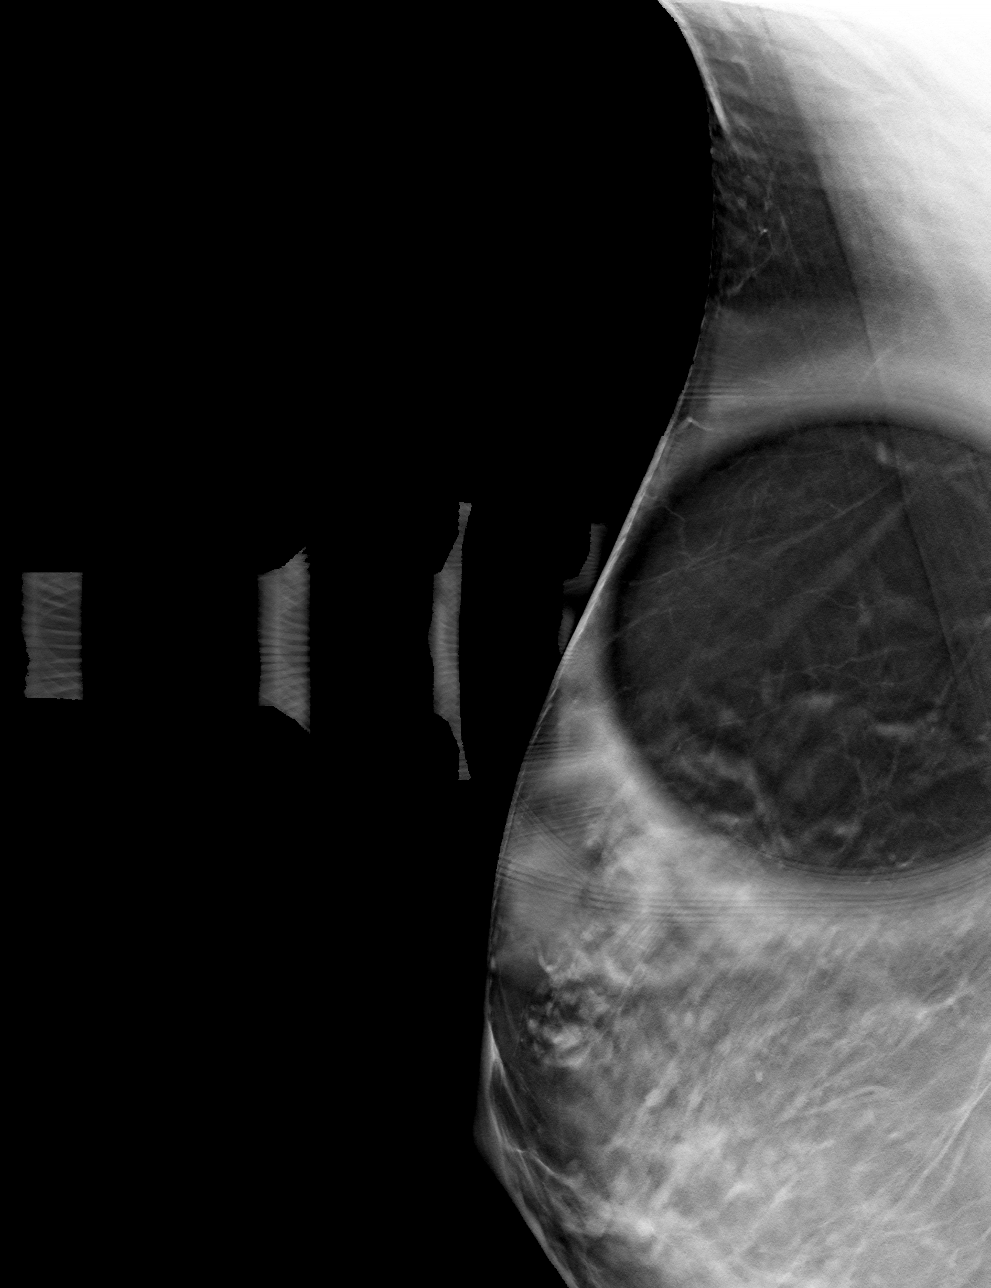

[4 of 12 positions shown; findings below may reference images not displayed]

ACR Breast Density Category c: The breast tissue is heterogeneously
dense, which may obscure small masses.
FINDINGS: On today's additional diagnostic views, including spot compression
with 3D tomosynthesis, there is no persistent asymmetry within the
upper RIGHT breast indicating superimposition of normal
fibroglandular tissues.
IMPRESSION: No evidence of malignancy.

Patient may return to routine annual bilateral screening mammogram
schedule.

RECOMMENDATION:
Screening mammogram in one year.(Code:[id number])

I have discussed the findings and recommendations with the patient.
If applicable, a reminder letter will be sent to the patient
regarding the next appointment.

BI-RADS CATEGORY  1: Negative.

## 2022-06-09 DIAGNOSIS — Z23 Encounter for immunization: Secondary | ICD-10-CM | POA: Diagnosis not present

## 2022-06-25 DIAGNOSIS — R5382 Chronic fatigue, unspecified: Secondary | ICD-10-CM | POA: Diagnosis not present

## 2022-06-25 DIAGNOSIS — F32A Depression, unspecified: Secondary | ICD-10-CM | POA: Diagnosis not present

## 2022-06-28 DIAGNOSIS — D485 Neoplasm of uncertain behavior of skin: Secondary | ICD-10-CM | POA: Diagnosis not present

## 2022-06-28 DIAGNOSIS — D2262 Melanocytic nevi of left upper limb, including shoulder: Secondary | ICD-10-CM | POA: Diagnosis not present

## 2022-06-28 DIAGNOSIS — D2272 Melanocytic nevi of left lower limb, including hip: Secondary | ICD-10-CM | POA: Diagnosis not present

## 2022-06-28 DIAGNOSIS — B079 Viral wart, unspecified: Secondary | ICD-10-CM | POA: Diagnosis not present

## 2022-06-28 DIAGNOSIS — D225 Melanocytic nevi of trunk: Secondary | ICD-10-CM | POA: Diagnosis not present

## 2022-06-28 DIAGNOSIS — L578 Other skin changes due to chronic exposure to nonionizing radiation: Secondary | ICD-10-CM | POA: Diagnosis not present

## 2022-06-28 DIAGNOSIS — L570 Actinic keratosis: Secondary | ICD-10-CM | POA: Diagnosis not present

## 2022-06-28 DIAGNOSIS — Z85828 Personal history of other malignant neoplasm of skin: Secondary | ICD-10-CM | POA: Diagnosis not present

## 2022-06-28 DIAGNOSIS — L821 Other seborrheic keratosis: Secondary | ICD-10-CM | POA: Diagnosis not present

## 2022-07-24 ENCOUNTER — Other Ambulatory Visit: Payer: Self-pay | Admitting: Family Medicine

## 2022-07-24 DIAGNOSIS — Z1231 Encounter for screening mammogram for malignant neoplasm of breast: Secondary | ICD-10-CM

## 2022-08-07 DIAGNOSIS — F419 Anxiety disorder, unspecified: Secondary | ICD-10-CM | POA: Diagnosis not present

## 2022-09-24 ENCOUNTER — Ambulatory Visit
Admission: RE | Admit: 2022-09-24 | Discharge: 2022-09-24 | Disposition: A | Discharge status: Home / Self Care | Payer: Medicare Other | Source: Ambulatory Visit | Attending: Family Medicine | Admitting: Family Medicine

## 2022-09-24 DIAGNOSIS — Z1231 Encounter for screening mammogram for malignant neoplasm of breast: Secondary | ICD-10-CM

## 2022-10-22 DIAGNOSIS — E7800 Pure hypercholesterolemia, unspecified: Secondary | ICD-10-CM | POA: Diagnosis not present

## 2022-10-22 DIAGNOSIS — M545 Low back pain, unspecified: Secondary | ICD-10-CM | POA: Diagnosis not present

## 2022-10-22 DIAGNOSIS — I10 Essential (primary) hypertension: Secondary | ICD-10-CM | POA: Diagnosis not present

## 2022-10-22 DIAGNOSIS — Z0000 Encounter for general adult medical examination without abnormal findings: Secondary | ICD-10-CM | POA: Diagnosis not present

## 2022-10-22 DIAGNOSIS — H9313 Tinnitus, bilateral: Secondary | ICD-10-CM | POA: Diagnosis not present

## 2022-10-22 DIAGNOSIS — E538 Deficiency of other specified B group vitamins: Secondary | ICD-10-CM | POA: Diagnosis not present

## 2022-11-13 DIAGNOSIS — S0083XA Contusion of other part of head, initial encounter: Secondary | ICD-10-CM | POA: Diagnosis not present

## 2022-11-13 DIAGNOSIS — W19XXXA Unspecified fall, initial encounter: Secondary | ICD-10-CM | POA: Diagnosis not present

## 2022-12-03 DIAGNOSIS — H532 Diplopia: Secondary | ICD-10-CM | POA: Diagnosis not present

## 2022-12-03 DIAGNOSIS — Z961 Presence of intraocular lens: Secondary | ICD-10-CM | POA: Diagnosis not present

## 2022-12-03 DIAGNOSIS — H04123 Dry eye syndrome of bilateral lacrimal glands: Secondary | ICD-10-CM | POA: Diagnosis not present

## 2022-12-03 DIAGNOSIS — H5051 Esophoria: Secondary | ICD-10-CM | POA: Diagnosis not present

## 2022-12-03 DIAGNOSIS — H43813 Vitreous degeneration, bilateral: Secondary | ICD-10-CM | POA: Diagnosis not present

## 2022-12-03 DIAGNOSIS — H35373 Puckering of macula, bilateral: Secondary | ICD-10-CM | POA: Diagnosis not present

## 2023-01-01 DIAGNOSIS — Z961 Presence of intraocular lens: Secondary | ICD-10-CM | POA: Diagnosis not present

## 2023-01-01 DIAGNOSIS — H5051 Esophoria: Secondary | ICD-10-CM | POA: Diagnosis not present

## 2023-01-01 DIAGNOSIS — Q9359 Other deletions of part of a chromosome: Secondary | ICD-10-CM | POA: Diagnosis not present

## 2023-01-01 DIAGNOSIS — Z9889 Other specified postprocedural states: Secondary | ICD-10-CM | POA: Diagnosis not present

## 2023-07-04 DIAGNOSIS — L578 Other skin changes due to chronic exposure to nonionizing radiation: Secondary | ICD-10-CM | POA: Diagnosis not present

## 2023-07-04 DIAGNOSIS — L570 Actinic keratosis: Secondary | ICD-10-CM | POA: Diagnosis not present

## 2023-07-04 DIAGNOSIS — D2272 Melanocytic nevi of left lower limb, including hip: Secondary | ICD-10-CM | POA: Diagnosis not present

## 2023-07-04 DIAGNOSIS — D225 Melanocytic nevi of trunk: Secondary | ICD-10-CM | POA: Diagnosis not present

## 2023-07-04 DIAGNOSIS — D2262 Melanocytic nevi of left upper limb, including shoulder: Secondary | ICD-10-CM | POA: Diagnosis not present

## 2023-07-04 DIAGNOSIS — Z85828 Personal history of other malignant neoplasm of skin: Secondary | ICD-10-CM | POA: Diagnosis not present

## 2023-07-04 DIAGNOSIS — L821 Other seborrheic keratosis: Secondary | ICD-10-CM | POA: Diagnosis not present

## 2023-07-04 DIAGNOSIS — L719 Rosacea, unspecified: Secondary | ICD-10-CM | POA: Diagnosis not present

## 2023-08-23 DIAGNOSIS — Z23 Encounter for immunization: Secondary | ICD-10-CM | POA: Diagnosis not present

## 2023-11-06 ENCOUNTER — Other Ambulatory Visit (HOSPITAL_COMMUNITY): Payer: Self-pay | Admitting: Family Medicine

## 2023-11-06 DIAGNOSIS — E538 Deficiency of other specified B group vitamins: Secondary | ICD-10-CM | POA: Diagnosis not present

## 2023-11-06 DIAGNOSIS — R053 Chronic cough: Secondary | ICD-10-CM | POA: Diagnosis not present

## 2023-11-06 DIAGNOSIS — E7800 Pure hypercholesterolemia, unspecified: Secondary | ICD-10-CM | POA: Diagnosis not present

## 2023-11-06 DIAGNOSIS — I10 Essential (primary) hypertension: Secondary | ICD-10-CM | POA: Diagnosis not present

## 2023-11-06 DIAGNOSIS — Z0000 Encounter for general adult medical examination without abnormal findings: Secondary | ICD-10-CM | POA: Diagnosis not present

## 2023-11-06 DIAGNOSIS — R634 Abnormal weight loss: Secondary | ICD-10-CM

## 2023-11-06 DIAGNOSIS — E559 Vitamin D deficiency, unspecified: Secondary | ICD-10-CM | POA: Diagnosis not present

## 2023-11-06 DIAGNOSIS — Z03818 Encounter for observation for suspected exposure to other biological agents ruled out: Secondary | ICD-10-CM | POA: Diagnosis not present

## 2023-11-06 DIAGNOSIS — Z23 Encounter for immunization: Secondary | ICD-10-CM | POA: Diagnosis not present

## 2023-11-08 ENCOUNTER — Encounter: Payer: Self-pay | Admitting: Gastroenterology

## 2023-11-11 ENCOUNTER — Other Ambulatory Visit: Payer: Self-pay | Admitting: Family Medicine

## 2023-11-11 DIAGNOSIS — Z1231 Encounter for screening mammogram for malignant neoplasm of breast: Secondary | ICD-10-CM

## 2023-11-18 DIAGNOSIS — I10 Essential (primary) hypertension: Secondary | ICD-10-CM | POA: Diagnosis not present

## 2023-11-18 DIAGNOSIS — R634 Abnormal weight loss: Secondary | ICD-10-CM | POA: Diagnosis not present

## 2023-11-29 ENCOUNTER — Ambulatory Visit
Admission: RE | Admit: 2023-11-29 | Discharge: 2023-11-29 | Disposition: A | Discharge status: Home / Self Care | Payer: Medicare Other | Source: Ambulatory Visit | Attending: Family Medicine | Admitting: Family Medicine

## 2023-11-29 DIAGNOSIS — Z1231 Encounter for screening mammogram for malignant neoplasm of breast: Secondary | ICD-10-CM | POA: Diagnosis not present

## 2023-12-06 ENCOUNTER — Ambulatory Visit
Admission: RE | Admit: 2023-12-06 | Discharge: 2023-12-06 | Disposition: A | Discharge status: Home / Self Care | Payer: Medicare Other | Source: Ambulatory Visit | Attending: Family Medicine | Admitting: Family Medicine

## 2023-12-06 DIAGNOSIS — R634 Abnormal weight loss: Secondary | ICD-10-CM

## 2023-12-06 DIAGNOSIS — K5730 Diverticulosis of large intestine without perforation or abscess without bleeding: Secondary | ICD-10-CM | POA: Diagnosis not present

## 2023-12-06 MED ORDER — IOPAMIDOL (ISOVUE-300) INJECTION 61%
500.0000 mL | Freq: Once | INTRAVENOUS | Status: AC | PRN
  Administered 2023-12-06: 80 mL via INTRAVENOUS

## 2023-12-09 DIAGNOSIS — H04123 Dry eye syndrome of bilateral lacrimal glands: Secondary | ICD-10-CM | POA: Diagnosis not present

## 2023-12-09 DIAGNOSIS — H5051 Esophoria: Secondary | ICD-10-CM | POA: Diagnosis not present

## 2023-12-09 DIAGNOSIS — H43813 Vitreous degeneration, bilateral: Secondary | ICD-10-CM | POA: Diagnosis not present

## 2023-12-09 DIAGNOSIS — Z961 Presence of intraocular lens: Secondary | ICD-10-CM | POA: Diagnosis not present

## 2023-12-09 DIAGNOSIS — H532 Diplopia: Secondary | ICD-10-CM | POA: Diagnosis not present

## 2023-12-09 DIAGNOSIS — H35373 Puckering of macula, bilateral: Secondary | ICD-10-CM | POA: Diagnosis not present

## 2023-12-24 DIAGNOSIS — R634 Abnormal weight loss: Secondary | ICD-10-CM | POA: Diagnosis not present

## 2023-12-30 ENCOUNTER — Ambulatory Visit (INDEPENDENT_AMBULATORY_CARE_PROVIDER_SITE_OTHER): Payer: Medicare Other | Admitting: Gastroenterology

## 2023-12-30 ENCOUNTER — Encounter: Payer: Self-pay | Admitting: Gastroenterology

## 2023-12-30 VITALS — BP 118/60 | HR 88 | Ht 62.0 in | Wt 132.0 lb

## 2023-12-30 DIAGNOSIS — R634 Abnormal weight loss: Secondary | ICD-10-CM | POA: Diagnosis not present

## 2023-12-30 DIAGNOSIS — Z860100 Personal history of colon polyps, unspecified: Secondary | ICD-10-CM

## 2023-12-30 DIAGNOSIS — R630 Anorexia: Secondary | ICD-10-CM

## 2023-12-30 DIAGNOSIS — Z860101 Personal history of adenomatous and serrated colon polyps: Secondary | ICD-10-CM | POA: Diagnosis not present

## 2023-12-30 MED ORDER — SUTAB 1479-225-188 MG PO TABS: ORAL_TABLET | ORAL | 0 refills | Status: DC

## 2023-12-30 NOTE — Progress Notes (Addendum)
 Chief Complaint: Unintentional weight loss Primary GI Doctor:(previously Dr. Christella Hartigan) Dr. Doy Hutching  HPI:  Patient is a 78 year old female patient with past medical history of vitamin D deficiency, elevated blood pressure, and history of tubular adenoma (2017) who was referred to me by Shon Hale, * on 11/06/2023 for a complaint of unintentional weight loss.    Interval History   Patient presents to discuss weight loss of about 30lbs over the past year, she states that has been steady since November. She states her appetite has decreased and eating smaller portions. She states she feels full quickly. No nausea or vomiting. Patient denies GERD or dysphagia. No known external stressors. No travel or exposure. She has occasional diarrhea with eating certain foods such as ruffage.   She drinks 2 glasses wine daily. Nonsmoker.  No NSAID's.  Patient's family history includes mother with IBS and brother with Crohn's disease.  Wt Readings from Last 3 Encounters:  12/30/23 132 lb (59.9 kg)  07/18/20 158 lb 6.4 oz (71.8 kg)  04/27/19 150 lb (68 kg)    Past Medical History:  Diagnosis Date   Contusion, R foot- sees dr Penni Bombard- [facility]    bruised right foot   Elevated blood pressure (not hypertension) 04/09/2016   Overweight (BMI 25.0-29.9) 04/09/2016   S/P bilateral breast reduction- 2000    15 years ago   Status post left knee replacement- 2004    Vitamin D insufficiency 04/09/2016    Past Surgical History:  Procedure Laterality Date   BREAST BIOPSY Right    BREAST SURGERY Bilateral    reduction   COLONOSCOPY     REDUCTION MAMMAPLASTY Bilateral    REPLACEMENT TOTAL KNEE Left     Current Outpatient Medications  Medication Sig Dispense Refill   atorvastatin (LIPITOR) 10 MG tablet Take 1 tablet (10 mg total) by mouth at bedtime. 90 tablet 1   BIOTIN PO Take 1 capsule by mouth daily.     Calcium Carbonate (CALCIUM 500 PO) Take 1 capsule by mouth daily.     Cholecalciferol  (VITAMIN D3) 50 MCG (2000 UT) capsule Take 1 capsule (2,000 Units total) by mouth daily.     TURMERIC PO Take 1 tablet by mouth daily.     valsartan-hydrochlorothiazide (DIOVAN-HCT) 80-12.5 MG tablet Take 1 tablet by mouth daily.     Current Facility-Administered Medications  Medication Dose Route Frequency Provider Last Rate Last Admin   0.9 %  sodium chloride infusion  500 mL Intravenous Continuous Rachael Fee, MD        Allergies as of 12/30/2023 - Review Complete 12/30/2023  Allergen Reaction Noted   Sulfa antibiotics Other (See Comments) 04/05/2016    Family History  Problem Relation Age of Onset   Stroke Mother    COPD Father    Obesity Sister    Hypertension Sister    Crohn's disease Brother    Healthy Daughter    Healthy Son    Suicidality Brother     Review of Systems:    Constitutional: No weight loss, fever, chills, weakness or fatigue HEENT: Eyes: No change in vision               Ears, Nose, Throat:  No change in hearing or congestion Skin: No rash or itching Cardiovascular: No chest pain, chest pressure or palpitations   Respiratory: No SOB or cough Gastrointestinal: See HPI and otherwise negative Genitourinary: No dysuria or change in urinary frequency Neurological: No headache, dizziness or syncope  Musculoskeletal: No new muscle or joint pain Hematologic: No bleeding or bruising Psychiatric: No history of depression or anxiety    Physical Exam:  Vital signs: BP 118/60   Pulse 88   Ht 5\' 2"  (1.575 m)   Wt 132 lb (59.9 kg)   BMI 24.14 kg/m   Constitutional:   Pleasant female appears to be in NAD, Well developed, Well nourished, alert and cooperative Throat: Oral cavity and pharynx without inflammation, swelling or lesion.  Respiratory: Respirations even and unlabored. Lungs clear to auscultation bilaterally.   No wheezes, crackles, or rhonchi.  Cardiovascular: Normal S1, S2. Regular rate and rhythm. No peripheral edema, cyanosis or pallor.   Gastrointestinal:  Soft, nondistended, nontender. No rebound or guarding. Normal bowel sounds. No appreciable masses or hepatomegaly. Rectal:  Not performed.  Msk:  Symmetrical without gross deformities. Without edema, no deformity or joint abnormality.  Neurologic:  Alert and  oriented x4;  grossly normal neurologically.  Skin:   Dry and intact without significant lesions or rashes. Psychiatric: Oriented to person, place and time. Demonstrates good judgement and reason without abnormal affect or behaviors.  RELEVANT LABS AND IMAGING: CBC    Latest Ref Rng & Units 07/18/2020   10:03 AM 04/20/2019    9:18 AM 05/14/2016    8:39 AM  CBC  WBC 3.4 - 10.8 x10E3/uL 3.9  4.3  4.3   Hemoglobin 11.1 - 15.9 g/dL 16.1  09.6  04.5   Hematocrit 34.0 - 46.6 % 38.4  42.2  40.6   Platelets 150 - 450 x10E3/uL 240  244  277      CMP     Latest Ref Rng & Units 07/18/2020   10:03 AM 06/08/2019    9:07 AM 04/20/2019    9:18 AM  CMP  Glucose 65 - 99 mg/dL 93   86   BUN 8 - 27 mg/dL 6   7   Creatinine 4.09 - 1.00 mg/dL 8.11   9.14   Sodium 782 - 144 mmol/L 140   140   Potassium 3.5 - 5.2 mmol/L 4.4   3.8   Chloride 96 - 106 mmol/L 102   98   CO2 20 - 29 mmol/L 27   26   Calcium 8.7 - 10.3 mg/dL 9.0   9.5   Total Protein 6.0 - 8.5 g/dL 6.4   6.8   Total Bilirubin 0.0 - 1.2 mg/dL 0.5   0.4   Alkaline Phos 44 - 121 IU/L 113   101   AST 0 - 40 IU/L 37   24   ALT 0 - 32 IU/L 30  18  22       Lab Results  Component Value Date   TSH 3.250 07/18/2020  08/07/2016 colonoscopy with Dr. Christella Hartigan, recall 5 years Impression:  - Four 2 to 4 mm polyps in the descending colon and in the transverse colon, removed with a cold snare. Resected and retrieved.  - Diverticulosis in the left colon.  - The examination was otherwise normal on direct and retroflexion views. Path: Surgical [P], descending and transverse - TUBULAR ADENOMA (X2) - HYPERPLASTIC POLYP. - POLYPOID COLONIC MUCOSAL FRAGMENT WITH A LYMPHOID  AGGREGATE. - NO HIGH GRADE DYSPLASIA OR INVASIVE MALIGNANCY IDENTIFIED.  12/06/2023 CT abdomen pelvis with contrast IMPRESSION: *No acute abnormality of the abdomen or pelvis. *Diffuse diverticulosis of the descending and sigmoid colon without diverticulitis. *No evidence of intra-abdominal masses or adenopathy.  Assessment: Encounter Diagnoses  Name Primary?   Loss of appetite Yes  Loss of weight    History of colonic polyps       78 year old female patient with poor appetite and weight loss of 30lbs over the past year. Will schedule upper GI endoscopy with Dr. Yvone Herd to r/o gastritis, H pylori, or PUD. Reassuring negative CT scan. Patient also overdue for colon screening colonoscopy for history of tubular adenomas, will schedule today as well. Currently no lower GI issues to report.  Plan: - request labs from PCP to scan into chart -Schedule for a colonoscopy in [facility] with Dr. Yvone Herd. The risks and benefits of colonoscopy with possible polypectomy / biopsies were discussed and the patient agrees to proceed.  -Schedule EGD in [facility] with Dr. Yvone Herd. The risks and benefits of EGD with possible biopsies and esophageal dilation were discussed with the patient who agrees to proceed.  Thank you for the courtesy of this consult. Please call me with any questions or concerns.   Joann Tallo, FNP-C [facility] 12/30/2023, 10:55 AM  Cc: Ransom Byers, *  I have reviewed the clinic note as outlined by Arlon Lamb, NP and agree with the assessment, plan and medical decision making. Joann Wall presents for evaluation and management of loss of appetite and unintentional 30 pound weight loss over the last year.  Endorses fullness and early satiety.  CT scan unrevealing.  No evidence of anemia on labs.  Reasonable to pursue EGD and colonoscopy in the setting of current symptoms and based upon the fact that patient is due for surveillance colonoscopy for history of tubular  adenoma.  Eugenia Hess, MD

## 2023-12-30 NOTE — Patient Instructions (Signed)
 We have sent the following medications to your pharmacy for you to pick up at your convenience: SUTAB You have been scheduled for an endoscopy and colonoscopy. Please follow the written instructions given to you at your visit today.  If you use inhalers (even only as needed), please bring them with you on the day of your procedure.  DO NOT TAKE 7 DAYS PRIOR TO TEST- Trulicity (dulaglutide) Ozempic, Wegovy (semaglutide) Mounjaro (tirzepatide) Bydureon Bcise (exanatide extended release)  DO NOT TAKE 1 DAY PRIOR TO YOUR TEST Rybelsus (semaglutide) Adlyxin (lixisenatide) Victoza (liraglutide) Byetta (exanatide) __________________________________________________________________________ Due to recent changes in healthcare laws, you may see the results of your imaging and laboratory studies on MyChart before your provider has had a chance to review them.  We understand that in some cases there may be results that are confusing or concerning to you. Not all laboratory results come back in the same time frame and the provider may be waiting for multiple results in order to interpret others.  Please give us  48 hours in order for your provider to thoroughly review all the results before contacting the office for clarification of your results.   _______________________________________________________  If your blood pressure at your visit was 140/90 or greater, please contact your primary care physician to follow up on this.  _______________________________________________________  If you are age 79 or older, your body mass index should be between 23-30. Your Body mass index is 24.14 kg/m. If this is out of the aforementioned range listed, please consider follow up with your Primary Care Provider.  If you are age 79 or younger, your body mass index should be between 19-25. Your Body mass index is 24.14 kg/m. If this is out of the aformentioned range listed, please consider follow up with your  Primary Care Provider.   ________________________________________________________  The [facility] providers would like to encourage you to use MYCHART to communicate with providers for non-urgent requests or questions.  Due to long hold times on the telephone, sending your provider a message by [facility] may be a faster and more efficient way to get a response.  Please allow 48 business hours for a response.  Please remember that this is for non-urgent requests.  _______________________________________________________   Thank you for trusting me with your gastrointestinal care!   Dyanna Glasgow, RNP

## 2024-01-30 ENCOUNTER — Telehealth: Payer: Self-pay | Admitting: Gastroenterology

## 2024-01-30 NOTE — Telephone Encounter (Signed)
 Labs reviewed from PCP on 12/24/2023 CRP 6 BUN 20, creatinine 0.53, WBC 5.8, hemoglobin 12.4, platelets 198, Labs reviewed from 11/06/2023 sed rate 12, B12 834, vitamin D  97.1,

## 2024-02-10 NOTE — Progress Notes (Unsigned)
 [facility] History and Physical   Primary Care Physician:  Ransom Byers, MD   Reason for Procedure:  Unintentional weight loss, early satiety, diarrhea history of adenomatous colon polyps  Plan:    Upper endoscopy and colonoscopy     HPI: Joann Wall is a 78 y.o. female undergoing upper endoscopy and colonoscopy for investigation of weight loss, early satiety and surveillance of adenomatous colon polyps.  Patient reports a 30 pound weight loss over the past year that has been unintentional and steady.  Endorses diminished appetite and early satiety.  No nausea, vomiting, GERD or dysphagia.  Has intermittent diarrhea when consuming roughage.  Last colonoscopy was performed in 2017 disclosing 4 subcentimeter polyps.  2 of these were tubular adenomas.  No family history of colorectal cancer or polyps.  There is a brother with Crohn's disease.   Past Medical History:  Diagnosis Date   Contusion, R foot- sees dr Jacqulyne Maxim- [facility]    bruised right foot   Elevated blood pressure (not hypertension) 04/09/2016   Overweight (BMI 25.0-29.9) 04/09/2016   S/P bilateral breast reduction- 2000    15 years ago   Status post left knee replacement- 2004    Vitamin D  insufficiency 04/09/2016    Past Surgical History:  Procedure Laterality Date   BREAST BIOPSY Right    BREAST SURGERY Bilateral    reduction   COLONOSCOPY     REDUCTION MAMMAPLASTY Bilateral    REPLACEMENT TOTAL KNEE Left     Prior to Admission medications   Medication Sig Start Date End Date Taking? Authorizing Provider  atorvastatin  (LIPITOR) 10 MG tablet Take 1 tablet (10 mg total) by mouth at bedtime. 07/19/20   Abonza, Maritza, PA-C  BIOTIN PO Take 1 capsule by mouth daily.    [provider]  Calcium  Carbonate (CALCIUM  500 PO) Take 1 capsule by mouth daily.    [provider]  Cholecalciferol (VITAMIN D3) 50 MCG (2000 UT) capsule Take 1 capsule (2,000 Units total) by mouth  daily. 08/26/19   Marceil Sensor, DO  Sodium Sulfate-Mag Sulfate-KCl (SUTAB ) 1479-225-188 MG TABS Use as directed for colonoscopy. MANUFACTURER CODES!! BIN: [id number] PCN: CN GROUP: [id number] MEMBER ID: [id number];XBM AS SECONDARY INSURANCE ;NO PRIOR AUTHORIZATION 12/30/23   May, Deanna J, NP  TURMERIC PO Take 1 tablet by mouth daily.    [provider]  valsartan-hydrochlorothiazide (DIOVAN-HCT) 80-12.5 MG tablet Take 1 tablet by mouth daily. 11/12/23   [provider]    Current Outpatient Medications  Medication Sig Dispense Refill   atorvastatin  (LIPITOR) 10 MG tablet Take 1 tablet (10 mg total) by mouth at bedtime. 90 tablet 1   BIOTIN PO Take 1 capsule by mouth daily.     Calcium  Carbonate (CALCIUM  500 PO) Take 1 capsule by mouth daily.     Cholecalciferol (VITAMIN D3) 50 MCG (2000 UT) capsule Take 1 capsule (2,000 Units total) by mouth daily.     Sodium Sulfate-Mag Sulfate-KCl (SUTAB ) 1479-225-188 MG TABS Use as directed for colonoscopy. MANUFACTURER CODES!! BIN: [id number] PCN: CN GROUP: [id number] MEMBER ID: [id number];HKV AS SECONDARY INSURANCE ;NO PRIOR AUTHORIZATION 24 tablet 0   TURMERIC PO Take 1 tablet by mouth daily.     valsartan-hydrochlorothiazide (DIOVAN-HCT) 80-12.5 MG tablet Take 1 tablet by mouth daily.     Current Facility-Administered Medications  Medication Dose Route Frequency Provider Last Rate Last Admin   0.9 %  sodium chloride  infusion  500 mL Intravenous Continuous Janel Medford, MD  Allergies as of 02/11/2024 - Review Complete 12/30/2023  Allergen Reaction Noted   Sulfa antibiotics Other (See Comments) 04/05/2016    Family History  Problem Relation Age of Onset   Stroke Mother    COPD Father    Obesity Sister    Hypertension Sister    Crohn's disease Brother    Healthy Daughter    Healthy Son    Suicidality Brother     Social History   Socioeconomic History   Marital status: Widowed    Spouse name: Not on file   Number of  children: 2   Years of education: Not on file   Highest education level: Not on file  Occupational History   Not on file  Tobacco Use   Smoking status: Never   Smokeless tobacco: Never  Vaping Use   Vaping status: Never Used  Substance and Sexual Activity   Alcohol use: Yes    Alcohol/week: 5.0 standard drinks of alcohol    Types: 5 Glasses of wine per week   Drug use: No   Sexual activity: Never  Other Topics Concern   Not on file  Social History Narrative   Not on file   Social Drivers of Health   Financial Resource Strain: Not on file  Food Insecurity: Not on file  Transportation Needs: Not on file  Physical Activity: Not on file  Stress: Not on file  Social Connections: Not on file  Intimate Partner Violence: Not on file    Review of Systems:  All other review of systems negative except as mentioned in the HPI.  Physical Exam: Vital signs There were no vitals taken for this visit.  General:   Alert,  Well-developed, well-nourished, pleasant and cooperative in NAD Airway:  Mallampati  Lungs:  Clear throughout to auscultation.   Heart:  Regular rate and rhythm; no murmurs, clicks, rubs,  or gallops. Abdomen:  Soft, nontender and nondistended. Normal bowel sounds.   Neuro/Psych:  Normal mood and affect. A and O x 3  Eugenia Hess, MD [facility]

## 2024-02-11 ENCOUNTER — Ambulatory Visit: Admitting: Pediatrics

## 2024-02-11 ENCOUNTER — Encounter: Payer: Self-pay | Admitting: Pediatrics

## 2024-02-11 VITALS — BP 112/45 | HR 64 | Temp 98.0°F | Resp 12 | Ht 62.0 in | Wt 132.0 lb

## 2024-02-11 DIAGNOSIS — R197 Diarrhea, unspecified: Secondary | ICD-10-CM | POA: Diagnosis not present

## 2024-02-11 DIAGNOSIS — K5730 Diverticulosis of large intestine without perforation or abscess without bleeding: Secondary | ICD-10-CM | POA: Diagnosis not present

## 2024-02-11 DIAGNOSIS — E663 Overweight: Secondary | ICD-10-CM | POA: Diagnosis not present

## 2024-02-11 DIAGNOSIS — K449 Diaphragmatic hernia without obstruction or gangrene: Secondary | ICD-10-CM | POA: Diagnosis not present

## 2024-02-11 DIAGNOSIS — K644 Residual hemorrhoidal skin tags: Secondary | ICD-10-CM

## 2024-02-11 DIAGNOSIS — K2970 Gastritis, unspecified, without bleeding: Secondary | ICD-10-CM | POA: Diagnosis not present

## 2024-02-11 DIAGNOSIS — D123 Benign neoplasm of transverse colon: Secondary | ICD-10-CM

## 2024-02-11 DIAGNOSIS — Z860100 Personal history of colon polyps, unspecified: Secondary | ICD-10-CM

## 2024-02-11 DIAGNOSIS — R630 Anorexia: Secondary | ICD-10-CM

## 2024-02-11 DIAGNOSIS — D124 Benign neoplasm of descending colon: Secondary | ICD-10-CM

## 2024-02-11 DIAGNOSIS — Z1211 Encounter for screening for malignant neoplasm of colon: Secondary | ICD-10-CM | POA: Diagnosis not present

## 2024-02-11 DIAGNOSIS — K648 Other hemorrhoids: Secondary | ICD-10-CM

## 2024-02-11 DIAGNOSIS — K259 Gastric ulcer, unspecified as acute or chronic, without hemorrhage or perforation: Secondary | ICD-10-CM | POA: Diagnosis not present

## 2024-02-11 DIAGNOSIS — Z860101 Personal history of adenomatous and serrated colon polyps: Secondary | ICD-10-CM | POA: Diagnosis not present

## 2024-02-11 DIAGNOSIS — R634 Abnormal weight loss: Secondary | ICD-10-CM | POA: Diagnosis not present

## 2024-02-11 DIAGNOSIS — R6881 Early satiety: Secondary | ICD-10-CM | POA: Diagnosis not present

## 2024-02-11 MED ORDER — SODIUM CHLORIDE 0.9 % IV SOLN: 500.0000 mL | INTRAVENOUS | Status: DC

## 2024-02-11 NOTE — Progress Notes (Signed)
 Patient to schedule a 2 month return to GI clinic after checking her schedule. B.Quang Thorpe RN.

## 2024-02-11 NOTE — Op Note (Signed)
 [facility] Patient Name: Joann Wall Procedure Date: 02/11/2024 2:06 PM MRN: [id number] Endoscopist: Eugenia Hess , MD, [id number] Age: 78 Referring MD:  Date of Birth: [birth year] Gender: Female Account #: [id number] Procedure:                Colonoscopy Indications:              High risk colon cancer surveillance: Personal                            history of non-advanced adenoma, Last colonoscopy:                            2017 Medicines:                Monitored Anesthesia Care Procedure:                Pre-Anesthesia Assessment:                           - Prior to the procedure, a History and Physical                            was performed, and patient medications and                            allergies were reviewed. The patient's tolerance of                            previous anesthesia was also reviewed. The risks                            and benefits of the procedure and the sedation                            options and risks were discussed with the patient.                            All questions were answered, and informed consent                            was obtained. Prior Anticoagulants: The patient has                            taken no anticoagulant or antiplatelet agents. ASA                            Grade Assessment: II - A patient with mild systemic                            disease. After reviewing the risks and benefits,                            the patient was deemed in satisfactory condition to  undergo the procedure.                           After obtaining informed consent, the colonoscope                            was passed under direct vision. Throughout the                            procedure, the patient's blood pressure, pulse, and                            oxygen saturations were monitored continuously. The                            Olympus Scope SN: [id number] was introduced through                             the anus and advanced to the cecum, identified by                            appendiceal orifice and ileocecal valve. The                            colonoscopy was performed without difficulty. The                            patient tolerated the procedure well. The quality                            of the bowel preparation was adequate. The terminal                            ileum, ileocecal valve, appendiceal orifice, and                            rectum were photographed. Scope In: 2:22:15 PM Scope Out: 2:43:22 PM Scope Withdrawal Time: 0 hours 16 minutes 0 seconds  Total Procedure Duration: 0 hours 21 minutes 7 seconds  Findings:                 Skin tags were found on perianal exam.                           The digital rectal exam was normal. Pertinent                            negatives include normal sphincter tone and no                            palpable rectal lesions.                           Multiple small-mouthed diverticula were found in  the sigmoid colon.                           Normal mucosa was found in the entire colon.                            Biopsies for histology were taken with a cold                            forceps for evaluation of microscopic colitis.                           Two sessile polyps were found in the descending                            colon and transverse colon. The polyps were 3 to 4                            mm in size. These polyps were removed with a cold                            biopsy forceps. Resection and retrieval were                            complete.                           The terminal ileum appeared normal.                           Internal hemorrhoids were found during retroflexion. Complications:            No immediate complications. Estimated blood loss:                            Minimal. Estimated Blood Loss:     Estimated blood loss was  minimal. Impression:               - Perianal skin tags found on perianal exam.                           - Diverticulosis in the sigmoid colon.                           - Normal mucosa in the entire examined colon.                            Biopsied.                           - Two 3 to 4 mm polyps in the descending colon and                            in the transverse colon, removed with a cold biopsy  forceps. Resected and retrieved.                           - The examined portion of the ileum was normal.                           - Internal hemorrhoids. Recommendation:           - Discharge patient to home (ambulatory).                           - Await pathology results.                           - The findings and recommendations were discussed                            with the patient's family.                           - Return to GI clinic in 2 months.                           - Patient has a contact number available for                            emergencies. The signs and symptoms of potential                            delayed complications were discussed with the                            patient. Return to normal activities tomorrow.                            Written discharge instructions were provided to the                            patient. Eugenia Hess, MD 02/11/2024 2:50:32 PM This report has been signed electronically.

## 2024-02-11 NOTE — Progress Notes (Signed)
 Report to PACU, RN, vss, BBS= Clear.

## 2024-02-11 NOTE — Op Note (Signed)
 [facility] Patient Name: Joann Wall Procedure Date: 02/11/2024 2:08 PM MRN: [id number] Endoscopist: Eugenia Hess , MD, [id number] Age: 78 Referring MD:  Date of Birth: [birth year] Gender: Female Account #: [id number] Procedure:                Upper GI endoscopy Indications:              Anorexia, Diarrhea, Early satiety, Weight loss Medicines:                Monitored Anesthesia Care Procedure:                Pre-Anesthesia Assessment:                           - Prior to the procedure, a History and Physical                            was performed, and patient medications and                            allergies were reviewed. The patient's tolerance of                            previous anesthesia was also reviewed. The risks                            and benefits of the procedure and the sedation                            options and risks were discussed with the patient.                            All questions were answered, and informed consent                            was obtained. Prior Anticoagulants: The patient has                            taken no anticoagulant or antiplatelet agents. ASA                            Grade Assessment: II - A patient with mild systemic                            disease. After reviewing the risks and benefits,                            the patient was deemed in satisfactory condition to                            undergo the procedure.                           After obtaining informed consent, the endoscope was  passed under direct vision. Throughout the                            procedure, the patient's blood pressure, pulse, and                            oxygen saturations were monitored continuously. The                            GIF [id number] #[id number] was introduced through the                            mouth, and advanced to the duodenal bulb. The upper                            GI  endoscopy was accomplished without difficulty.                            The patient tolerated the procedure well. Scope In: Scope Out: Findings:                 The examined esophagus was normal.                           Diffuse moderate inflammation characterized by                            adherent blood, congestion (edema), erythema,                            friability and granularity was found in the entire                            examined stomach. Biopsies were taken with a cold                            forceps for Helicobacter pylori testing.                           A small hiatal hernia was present.                           The duodenal bulb and second portion of the                            duodenum were normal. Biopsies for histology were                            taken with a cold forceps for evaluation of celiac                            disease. Complications:            No immediate complications. Estimated blood loss:  Minimal. Estimated Blood Loss:     Estimated blood loss was minimal. Impression:               - Normal esophagus.                           - Gastritis. Biopsied.                           - Small hiatal hernia.                           - Normal duodenal bulb and second portion of the                            duodenum. Biopsied. Recommendation:           - Await pathology results.                           - Perform a colonoscopy today.                           - The findings and recommendations were discussed                            with the patient's family. Eugenia Hess, MD 02/11/2024 2:46:25 PM This report has been signed electronically.

## 2024-02-11 NOTE — Patient Instructions (Signed)
 Please read handouts provided. Await pathology results. Return to GI clinic in 2 months.   YOU HAD AN ENDOSCOPIC PROCEDURE TODAY AT THE [facility]:   Refer to the procedure report that was given to you for any specific questions about what was found during the examination.  If the procedure report does not answer your questions, please call your gastroenterologist to clarify.  If you requested that your care partner not be given the details of your procedure findings, then the procedure report has been included in a sealed envelope for you to review at your convenience later.  YOU SHOULD EXPECT: Some feelings of bloating in the abdomen. Passage of more gas than usual.  Walking can help get rid of the air that was put into your GI tract during the procedure and reduce the bloating. If you had a lower endoscopy (such as a colonoscopy or flexible sigmoidoscopy) you may notice spotting of blood in your stool or on the toilet paper. If you underwent a bowel prep for your procedure, you may not have a normal bowel movement for a few days.  Please Note:  You might notice some irritation and congestion in your nose or some drainage.  This is from the oxygen used during your procedure.  There is no need for concern and it should clear up in a day or so.  SYMPTOMS TO REPORT IMMEDIATELY:  Following lower endoscopy (colonoscopy or flexible sigmoidoscopy):  Excessive amounts of blood in the stool  Significant tenderness or worsening of abdominal pains  Swelling of the abdomen that is new, acute  Fever of 100F or higher  Following upper endoscopy (EGD)  Vomiting of blood or coffee ground material  New chest pain or pain under the shoulder blades  Painful or persistently difficult swallowing  New shortness of breath  Fever of 100F or higher  Black, tarry-looking stools  For urgent or emergent issues, a gastroenterologist can be reached at any hour by calling [phone]. Do not use  MyChart messaging for urgent concerns.    DIET:  We do recommend a small meal at first, but then you may proceed to your regular diet.  Drink plenty of fluids but you should avoid alcoholic beverages for 24 hours.  ACTIVITY:  You should plan to take it easy for the rest of today and you should NOT DRIVE or use heavy machinery until tomorrow (because of the sedation medicines used during the test).    FOLLOW UP: Our staff will call the number listed on your records the next business day following your procedure.  We will call around 7:15- 8:00 am to check on you and address any questions or concerns that you may have regarding the information given to you following your procedure. If we do not reach you, we will leave a message.     If any biopsies were taken you will be contacted by phone or by letter within the next 1-3 weeks.  Please call us  at [phone] if you have not heard about the biopsies in 3 weeks.    SIGNATURES/CONFIDENTIALITY: You and/or your care partner have signed paperwork which will be entered into your electronic medical record.  These signatures attest to the fact that that the information above on your After Visit Summary has been reviewed and is understood.  Full responsibility of the confidentiality of this discharge information lies with you and/or your care-partner.

## 2024-02-12 ENCOUNTER — Telehealth: Payer: Self-pay

## 2024-02-12 NOTE — Telephone Encounter (Signed)
  Follow up Call-     02/11/2024    1:06 PM  Call back number  Post procedure Call Back phone  # [phone]  Permission to leave phone message Yes     Patient questions:  Do you have a fever, pain , or abdominal swelling? No. Pain Score  0 *  Have you tolerated food without any problems? Yes.    Have you been able to return to your normal activities? Yes.    Do you have any questions about your discharge instructions: Diet   No. Medications  No. Follow up visit  No.  Do you have questions or concerns about your Care? No.  Actions: * If pain score is 4 or above: No action needed, pain <4.

## 2024-02-14 LAB — SURGICAL PATHOLOGY

## 2024-02-17 ENCOUNTER — Ambulatory Visit: Payer: Self-pay | Admitting: Pediatrics

## 2024-04-13 ENCOUNTER — Ambulatory Visit: Admitting: Physician Assistant

## 2024-04-21 ENCOUNTER — Other Ambulatory Visit (INDEPENDENT_AMBULATORY_CARE_PROVIDER_SITE_OTHER)

## 2024-04-21 ENCOUNTER — Ambulatory Visit (INDEPENDENT_AMBULATORY_CARE_PROVIDER_SITE_OTHER): Admitting: Physician Assistant

## 2024-04-21 ENCOUNTER — Encounter: Payer: Self-pay | Admitting: Physician Assistant

## 2024-04-21 VITALS — BP 160/80 | HR 67 | Ht 62.0 in | Wt 129.0 lb

## 2024-04-21 DIAGNOSIS — R630 Anorexia: Secondary | ICD-10-CM

## 2024-04-21 DIAGNOSIS — Z860101 Personal history of adenomatous and serrated colon polyps: Secondary | ICD-10-CM

## 2024-04-21 DIAGNOSIS — R6881 Early satiety: Secondary | ICD-10-CM

## 2024-04-21 DIAGNOSIS — R634 Abnormal weight loss: Secondary | ICD-10-CM

## 2024-04-21 DIAGNOSIS — Z8719 Personal history of other diseases of the digestive system: Secondary | ICD-10-CM | POA: Diagnosis not present

## 2024-04-21 DIAGNOSIS — R197 Diarrhea, unspecified: Secondary | ICD-10-CM | POA: Diagnosis not present

## 2024-04-21 LAB — T3: T3, Total: 86 ng/dL (ref 76–181)

## 2024-04-21 LAB — C-REACTIVE PROTEIN: CRP: <1 mg/dL (ref 0.5–20.0)

## 2024-04-21 LAB — T4, FREE: Free T4: 1.11 ng/dL (ref 0.60–1.60)

## 2024-04-21 LAB — SEDIMENTATION RATE: Sed Rate: 12 mm/h (ref 0–30)

## 2024-04-21 LAB — TSH: TSH: 2.88 u[IU]/mL (ref 0.35–5.50)

## 2024-04-21 NOTE — Progress Notes (Addendum)
 04/21/2024 Joann Wall [place] [id number] 03/25/1946  Referring provider: Chrystal Lamarr RAMAN, * Primary GI doctor: Dr. Suzann  ASSESSMENT AND PLAN:  Weight loss 30 pounds over the last year with symptoms of early satiety,diarrhea, had increasing stress 12/06/2023 CTAP WC no acute abnormality, diffuse diverticulosis descending sigmoid colon without diverticulitis 02/11/2024 EGD Dr. Suzann normal esophagus, gastritis, small HH normal duodenum, negative celiac, no H. pylori 02/11/2024 colonoscopy diverticulosis sigmoid normal mucosa 2 polyps 3 to 4 mm descending transverse colon normal TI, internal hemorrhoids, no microscopic colitis or dysplasia - possible stress related with her kids and possible move to [place], talk with PCP, can consider increasing or switching the zoloft - will check TSH/T3/T4, ESR/CRP -consider SIBO - consider GES, gastroparesis diet given  Diarrhea Negative microscopic colitis 02/11/2024 - loose stools mainly in the morning, not associated with food, no oily stools - consider adding on benefier  Personal history of tubular adenomatous polyps 02/11/2024 colonoscopy diverticulosis sigmoid normal mucosa 2 TA polyps 3 to 4 mm descending transverse colon normal TI, internal hemorrhoids, no microscopic colitis or dysplasia No follow-up surveillance colonoscopy recommended  I have reviewed the clinic note as outlined by Alan Coombs, PA and agree with the assessment, plan and medical decision making.  Wall returns to the office status post EGD and colonoscopy that did not disclose any worrisome abnormalities to account for her weight loss-no H. pylori, celiac disease, IBD or microscopic colitis.  Agree with additional laboratory studies for investigation of weight loss.  Reasonable to trial Benefiber for diarrhea.  Consider SIBO or bile acid malabsorption in the future.  No further surveillance colonoscopies based upon age.  Inocente Suzann, MD   Patient Care  Team: Chrystal Lamarr RAMAN, MD as PCP - General (Family Medicine) Robinson Pao, MD as Consulting Physician (Dermatology) Octavia Bruckner, MD as Consulting Physician (Ophthalmology)  HISTORY OF PRESENT ILLNESS: 78 y.o. female with a past medical history listed below presents for evaluation of weight loss.   Patient was last seen in the office 12/30/2023 by Cathryne May, NP for unintentional weight loss.  Discussed the use of AI scribe software for clinical note transcription with the patient, who gave verbal consent to proceed.  History of Present Illness   Joann Wall is a 78 year old female who presents with weight loss and early satiety.  She has experienced weight loss, which has now stabilized. She attributes the initial weight loss to stress related to her children and a decreased appetite. She eats three times a day and cooks healthy meals but feels full quickly, which is a new symptom for her.  She underwent an EGD and colonoscopy in April. The EGD showed mild gastritis but no H. pylori or celiac disease. The colonoscopy revealed diverticulosis, polyps, and hemorrhoids, but no inflammation. She has loose stools primarily in the morning, but this has decreased in frequency since the endoscopy and colonoscopy. She has regular bowel movements without constipation.  No respiratory issues or urinary symptoms. No changes in vision, headaches, or joint pain, except for some arthritis-related joint pain. No palpitations or fast heartbeats.  She is currently on Zoloft, which she started approximately two years ago during a depressive period. She sleeps well, going to bed around 10 PM and waking between 3 and 5 AM. She maintains a healthy diet, including a protein shake every morning and meals like shrimp scampi with salad.  She is considering moving to [place]  to be closer to her children, which is  causing her some stress. She currently lives in a condo in [place] and  has a strong social network there but no family nearby.      She  reports that she has never smoked. She has never used smokeless tobacco. She reports current alcohol use of about 5.0 standard drinks of alcohol per week. She reports that she does not use drugs.  RELEVANT GI HISTORY, IMAGING AND LABS: Results   RADIOLOGY CT scan: Normal  DIAGNOSTIC EGD: No H. pylori, no celiac disease, mild gastritis Colonoscopy: Diverticulosis, polyps, hemorrhoids, no inflammation      CBC    Component Value Date/Time   WBC 3.9 07/18/2020 1003   WBC 4.3 05/14/2016 0839   RBC 3.83 07/18/2020 1003   RBC 4.01 05/14/2016 0839   HGB 13.0 07/18/2020 1003   HCT 38.4 07/18/2020 1003   PLT 240 07/18/2020 1003   MCV 100 (H) 07/18/2020 1003   MCH 33.9 (H) 07/18/2020 1003   MCH 33.9 (H) 05/14/2016 0839   MCHC 33.9 07/18/2020 1003   MCHC 33.5 05/14/2016 0839   RDW 12.5 07/18/2020 1003   LYMPHSABS 1.4 07/18/2020 1003   MONOABS 387 05/14/2016 0839   EOSABS 0.1 07/18/2020 1003   BASOSABS 0.0 07/18/2020 1003   No results for input(s): HGB in the last 8760 hours.  CMP     Component Value Date/Time   NA 140 07/18/2020 1003   K 4.4 07/18/2020 1003   CL 102 07/18/2020 1003   CO2 27 07/18/2020 1003   GLUCOSE 93 07/18/2020 1003   GLUCOSE 99 05/14/2016 0839   BUN 6 (L) 07/18/2020 1003   CREATININE 0.39 (L) 07/18/2020 1003   CREATININE 0.49 (L) 05/14/2016 0839   CALCIUM  9.0 07/18/2020 1003   PROT 6.4 07/18/2020 1003   ALBUMIN 4.1 07/18/2020 1003   AST 37 07/18/2020 1003   ALT 30 07/18/2020 1003   ALKPHOS 113 07/18/2020 1003   BILITOT 0.5 07/18/2020 1003   GFRNONAA 103 07/18/2020 1003   GFRNONAA >89 05/14/2016 0839   GFRAA 119 07/18/2020 1003   GFRAA >89 05/14/2016 0839      Latest Ref Rng & Units 07/18/2020   10:03 AM 06/08/2019    9:07 AM 04/20/2019    9:18 AM  Hepatic Function  Total Protein 6.0 - 8.5 g/dL 6.4   6.8   Albumin 3.7 - 4.7 g/dL 4.1   4.4   AST 0 - 40 IU/L 37   24   ALT 0 -  32 IU/L 30  18  22    Alk Phosphatase 44 - 121 IU/L 113   101   Total Bilirubin 0.0 - 1.2 mg/dL 0.5   0.4       Current Medications:    Current Outpatient Medications (Cardiovascular):    atorvastatin  (LIPITOR) 10 MG tablet, Take 1 tablet (10 mg total) by mouth at bedtime.   valsartan-hydrochlorothiazide (DIOVAN-HCT) 80-12.5 MG tablet, Take 1 tablet by mouth daily.     Current Outpatient Medications (Other):    BIOTIN PO, Take 1 capsule by mouth daily.   Calcium  Carbonate (CALCIUM  500 PO), Take 1 capsule by mouth daily.   Cholecalciferol (VITAMIN D3) 50 MCG (2000 UT) capsule, Take 1 capsule (2,000 Units total) by mouth daily.   sertraline (ZOLOFT) 50 MG tablet, Take 50 mg by mouth daily.   TURMERIC PO, Take 1 tablet by mouth daily.  Medical History:  Past Medical History:  Diagnosis Date   Arthritis    Cataract    Contusion, R foot-  sees dr arnaldo- [facility]    bruised right foot   Elevated blood pressure (not hypertension) 04/09/2016   Overweight (BMI 25.0-29.9) 04/09/2016   S/P bilateral breast reduction- 2000    15 years ago   Status post left knee replacement- 2004    Vitamin D  insufficiency 04/09/2016   Allergies:  Allergies  Allergen Reactions   Sulfa Antibiotics Other (See Comments)    disoriented     Surgical History:  She  has a past surgical history that includes Breast surgery (Bilateral); Replacement total knee (Left); Colonoscopy; Reduction mammaplasty (Bilateral); and Breast biopsy (Right). Family History:  Her family history includes COPD in her father; Crohn's disease in her brother; Healthy in her daughter and son; Hypertension in her sister; Obesity in her sister; Stroke in her mother; Suicidality in her brother.  REVIEW OF SYSTEMS  : All other systems reviewed and negative except where noted in the History of Present Illness.  PHYSICAL EXAM: BP (!) 160/80 (BP Location: Right Arm, Patient Position: Sitting, Cuff Size: Normal)   Pulse 67   Ht 5'  2 (1.575 m)   Wt 129 lb (58.5 kg)   BMI 23.59 kg/m  Physical Exam   MEASUREMENTS: Weight- 125-128. GENERAL APPEARANCE: Well nourished, in no acute distress. HEENT: No cervical lymphadenopathy, unremarkable thyroid , sclerae anicteric, conjunctiva pink. RESPIRATORY: Respiratory effort normal, lungs clear to auscultation bilaterally, breath sounds equal without rales, rhonchi, or wheezing. CARDIO: RRR with no MRGs, peripheral pulses intact. ABDOMEN: Soft, non-distended, active bowel sounds in all 4 quadrants, no tenderness to palpation, no rebound, no mass appreciated. RECTAL: Declines. MUSCULOSKELETAL: Full range of motion, normal gait, without edema. SKIN: Dry, intact without rashes or lesions. No jaundice. NEURO: Alert, oriented, no focal deficits. PSYCH: Cooperative, normal mood and affect.      Alan JONELLE Coombs, PA-C 3:30 PM

## 2024-04-21 NOTE — Patient Instructions (Addendum)
 Your provider has requested that you go to the basement level for lab work before leaving today. Press B on the elevator. The lab is located at the first door on the left as you exit the elevator.  Follow up with PCP  You can add on protein and things high calorie for weight gain - Can add ensure/boost to ice cream for a shake - can add protein powder to oatmeal after cooked or to a fruit smooth - avocado has high calorie, good to add to proteins - nuts and peanut butter are good to eat or add to yogurt/smoothies - [place] yogurt is good  Gastroparesis Gastroparesis is a condition in which food takes longer than normal to empty from the stomach.  This condition is also known as delayed gastric emptying. It is usually a long-term (chronic) condition.  What are the signs or symptoms? Symptoms of this condition include: Feeling full after eating very little or a loss of appetite. Nausea, vomiting, or heartburn. Bloating of your abdomen. Inconsistent blood sugar (glucose) levels on blood tests. Unexplained weight loss. Acid from the stomach coming up into the esophagus (gastroesophageal reflux). Sudden tightening (spasm) of the stomach, which can be painful. Symptoms may come and go. Some people may not notice any symptoms.  What increases the risk? You are more likely to develop this condition if: You have certain disorders or diseases. These may include: An endocrine disorder. An eating disorder. Amyloidosis. Scleroderma. Parkinson's disease. Multiple sclerosis. Cancer or infection of the stomach or the vagus nerve. You have had surgery on your stomach or vagus nerve. You take certain medicines. You are female.  Things you can do: Please do small frequent meals like 4-6 meals a day.  Eat and drink liquids at separate times.  Avoid high fiber foods, cook your vegetables, avoid high fat food.  Suggest spreading protein throughout the day (greek yogurt, glucerna, soft meat,  milk, eggs) Choose soft foods that you can mash with a fork When you are more symptomatic, change to pureed foods foods and liquids.  Consider reading Living well with Gastroparesis by [facility] Check out this link to a diet online https://my.GroupJournal.fr   Small intestinal bacterial overgrowth (SIBO) occurs when there is an abnormal increase in the overall bacterial population in the small intestine -- particularly types of bacteria not commonly found in that part of the digestive tract. Small intestinal bacterial overgrowth (SIBO) commonly results when a circumstance -- such as surgery or disease -- slows the passage of food and waste products in the digestive tract, creating a breeding ground for bacteria.  Signs and symptoms of SIBO often include: Loss of appetite Abdominal pain Nausea Bloating An uncomfortable feeling of fullness after eating Diarrhea or constipation, depending on the type of gas produced  What foods trigger SIBO? While foods aren't the original cause of SIBO, certain foods do encourage the overgrowth of the wrong bacteria in your small intestine. If you're feeding them their favorite foods, they're going to grow more, and that will trigger more of your SIBO symptoms. By the same token, you can help reduce the overgrowth by starving the problematic bacteria of their favorite foods. This strategy has led to a number of proposed SIBO eating plans. The plans vary, and so do individual results. But in general, they tend to recommend limiting carbohydrates.  These include: Sugars and sweeteners. Fruits and starchy vegetables. Dairy products. Grains.  There is a test for this we can do called a breath test, if  you are positive we will treat you with an antibiotic to see if it helps.  Your symptoms are very suspicious for this condition, as discussed, we will start you  on an antibiotic to see if this helps.

## 2024-04-22 ENCOUNTER — Ambulatory Visit: Payer: Self-pay | Admitting: Physician Assistant
